# Patient Record
Sex: Male | Born: 1935 | Race: White | Hispanic: No | Marital: Married | Smoking: Former smoker
Health system: Southern US, Community
[De-identification: ages and names within clinical notes are randomized; demographics above are authoritative.]

## PROBLEM LIST (undated history)

## (undated) DIAGNOSIS — C3491 Malignant neoplasm of unspecified part of right bronchus or lung: Secondary | ICD-10-CM

## (undated) DIAGNOSIS — I10 Essential (primary) hypertension: Secondary | ICD-10-CM

## (undated) DIAGNOSIS — E119 Type 2 diabetes mellitus without complications: Secondary | ICD-10-CM

## (undated) DIAGNOSIS — C3490 Malignant neoplasm of unspecified part of unspecified bronchus or lung: Secondary | ICD-10-CM

## (undated) DIAGNOSIS — N289 Disorder of kidney and ureter, unspecified: Secondary | ICD-10-CM

## (undated) DIAGNOSIS — M1990 Unspecified osteoarthritis, unspecified site: Secondary | ICD-10-CM

## (undated) DIAGNOSIS — J189 Pneumonia, unspecified organism: Secondary | ICD-10-CM

## (undated) DIAGNOSIS — C61 Malignant neoplasm of prostate: Secondary | ICD-10-CM

## (undated) DIAGNOSIS — K289 Gastrojejunal ulcer, unspecified as acute or chronic, without hemorrhage or perforation: Secondary | ICD-10-CM

## (undated) HISTORY — PX: OTHER SURGICAL HISTORY: SHX169

## (undated) HISTORY — DX: Malignant neoplasm of unspecified part of right bronchus or lung: C34.91

## (undated) HISTORY — PX: NOSE SURGERY: SHX723

## (undated) HISTORY — PX: TOE SURGERY: SHX1073

---

## 2004-06-29 ENCOUNTER — Ambulatory Visit: Payer: Self-pay | Admitting: Oncology

## 2004-07-02 ENCOUNTER — Ambulatory Visit: Payer: Self-pay | Admitting: Urology

## 2004-07-19 ENCOUNTER — Emergency Department: Payer: Self-pay | Admitting: Emergency Medicine

## 2004-07-21 ENCOUNTER — Emergency Department: Payer: Self-pay | Admitting: Emergency Medicine

## 2004-07-26 ENCOUNTER — Emergency Department: Payer: Self-pay | Admitting: Emergency Medicine

## 2004-07-27 ENCOUNTER — Emergency Department: Payer: Self-pay | Admitting: Unknown Physician Specialty

## 2004-07-29 ENCOUNTER — Ambulatory Visit: Payer: Self-pay | Admitting: Oncology

## 2008-09-25 ENCOUNTER — Emergency Department: Payer: Self-pay | Admitting: Emergency Medicine

## 2008-10-16 ENCOUNTER — Encounter: Payer: Self-pay | Admitting: Unknown Physician Specialty

## 2008-10-31 ENCOUNTER — Ambulatory Visit: Payer: Self-pay | Admitting: Gastroenterology

## 2009-10-11 ENCOUNTER — Inpatient Hospital Stay: Payer: Self-pay | Admitting: Internal Medicine

## 2011-06-09 ENCOUNTER — Emergency Department: Payer: Self-pay | Admitting: *Deleted

## 2013-07-05 ENCOUNTER — Ambulatory Visit: Payer: Self-pay

## 2013-07-12 ENCOUNTER — Ambulatory Visit: Payer: Self-pay | Admitting: Internal Medicine

## 2013-07-19 DIAGNOSIS — N1330 Unspecified hydronephrosis: Secondary | ICD-10-CM | POA: Insufficient documentation

## 2013-11-01 ENCOUNTER — Emergency Department: Payer: Self-pay | Admitting: Emergency Medicine

## 2013-11-01 LAB — COMPREHENSIVE METABOLIC PANEL
AST: 18 U/L (ref 15–37)
Albumin: 3.8 g/dL (ref 3.4–5.0)
Alkaline Phosphatase: 92 U/L
Anion Gap: 7 (ref 7–16)
BUN: 17 mg/dL (ref 7–18)
Bilirubin,Total: 0.6 mg/dL (ref 0.2–1.0)
CHLORIDE: 102 mmol/L (ref 98–107)
CO2: 26 mmol/L (ref 21–32)
Calcium, Total: 9.2 mg/dL (ref 8.5–10.1)
Creatinine: 1 mg/dL (ref 0.60–1.30)
EGFR (Non-African Amer.): >60
Glucose: 91 mg/dL (ref 65–99)
Osmolality: 271 (ref 275–301)
Potassium: 3.7 mmol/L (ref 3.5–5.1)
SGPT (ALT): 23 U/L (ref 12–78)
SODIUM: 135 mmol/L — AB (ref 136–145)
Total Protein: 7.4 g/dL (ref 6.4–8.2)

## 2013-11-01 LAB — URINALYSIS, COMPLETE
Bilirubin,UR: NEGATIVE
Glucose,UR: NEGATIVE mg/dL (ref 0–75)
Ketone: NEGATIVE
Nitrite: NEGATIVE
PH: 7 (ref 4.5–8.0)
PROTEIN: NEGATIVE
RBC,UR: 19 /HPF (ref 0–5)
SPECIFIC GRAVITY: 1.009 (ref 1.003–1.030)
SQUAMOUS EPITHELIAL: NONE SEEN
WBC UR: 42 /HPF (ref 0–5)

## 2013-11-01 LAB — CBC
HCT: 42.2 % (ref 40.0–52.0)
HGB: 13.9 g/dL (ref 13.0–18.0)
MCH: 29 pg (ref 26.0–34.0)
MCHC: 32.8 g/dL (ref 32.0–36.0)
MCV: 89 fL (ref 80–100)
PLATELETS: 272 10*3/uL (ref 150–440)
RBC: 4.78 10*6/uL (ref 4.40–5.90)
RDW: 13.5 % (ref 11.5–14.5)
WBC: 12.9 10*3/uL — ABNORMAL HIGH (ref 3.8–10.6)

## 2013-11-03 LAB — URINE CULTURE

## 2013-11-05 ENCOUNTER — Inpatient Hospital Stay: Payer: Self-pay | Admitting: Internal Medicine

## 2013-11-05 LAB — CBC WITH DIFFERENTIAL/PLATELET
BASOS PCT: 0.4 %
Basophil #: 0 10*3/uL (ref 0.0–0.1)
EOS PCT: 1.4 %
Eosinophil #: 0.1 10*3/uL (ref 0.0–0.7)
HCT: 41.9 % (ref 40.0–52.0)
HGB: 14.2 g/dL (ref 13.0–18.0)
LYMPHS PCT: 31 %
Lymphocyte #: 1.9 10*3/uL (ref 1.0–3.6)
MCH: 30.1 pg (ref 26.0–34.0)
MCHC: 34 g/dL (ref 32.0–36.0)
MCV: 89 fL (ref 80–100)
MONO ABS: 0.7 x10 3/mm (ref 0.2–1.0)
Monocyte %: 11.2 %
NEUTROS PCT: 56 %
Neutrophil #: 3.4 10*3/uL (ref 1.4–6.5)
Platelet: 251 10*3/uL (ref 150–440)
RBC: 4.73 10*6/uL (ref 4.40–5.90)
RDW: 13.6 % (ref 11.5–14.5)
WBC: 6 10*3/uL (ref 3.8–10.6)

## 2013-11-05 LAB — URINALYSIS, COMPLETE
Bacteria: NONE SEEN
Bilirubin,UR: NEGATIVE
GLUCOSE, UR: NEGATIVE mg/dL (ref 0–75)
Ketone: NEGATIVE
NITRITE: NEGATIVE
PROTEIN: NEGATIVE
Ph: 5 (ref 4.5–8.0)
Specific Gravity: 1.016 (ref 1.003–1.030)
Squamous Epithelial: 1
WBC UR: 33 /HPF (ref 0–5)

## 2013-11-05 LAB — BASIC METABOLIC PANEL
Anion Gap: 8 (ref 7–16)
BUN: 34 mg/dL — AB (ref 7–18)
CHLORIDE: 101 mmol/L (ref 98–107)
CO2: 26 mmol/L (ref 21–32)
Calcium, Total: 8.4 mg/dL — ABNORMAL LOW (ref 8.5–10.1)
Creatinine: 1.5 mg/dL — ABNORMAL HIGH (ref 0.60–1.30)
GFR CALC AF AMER: 47 — AB
GFR CALC NON AF AMER: 41 — AB
Glucose: 93 mg/dL (ref 65–99)
Osmolality: 277 (ref 275–301)
POTASSIUM: 3.9 mmol/L (ref 3.5–5.1)
Sodium: 135 mmol/L — ABNORMAL LOW (ref 136–145)

## 2013-11-05 LAB — TROPONIN I: TROPONIN-I: 0.02 ng/mL

## 2013-11-06 LAB — COMPREHENSIVE METABOLIC PANEL
ANION GAP: 5 — AB (ref 7–16)
Albumin: 2.7 g/dL — ABNORMAL LOW (ref 3.4–5.0)
Alkaline Phosphatase: 66 U/L
BILIRUBIN TOTAL: 0.3 mg/dL (ref 0.2–1.0)
BUN: 29 mg/dL — ABNORMAL HIGH (ref 7–18)
CALCIUM: 7.7 mg/dL — AB (ref 8.5–10.1)
Chloride: 103 mmol/L (ref 98–107)
Co2: 28 mmol/L (ref 21–32)
Creatinine: 1.31 mg/dL — ABNORMAL HIGH (ref 0.60–1.30)
EGFR (African American): 56 — ABNORMAL LOW
EGFR (Non-African Amer.): 48 — ABNORMAL LOW
Glucose: 129 mg/dL — ABNORMAL HIGH (ref 65–99)
Osmolality: 279 (ref 275–301)
POTASSIUM: 3.4 mmol/L — AB (ref 3.5–5.1)
SGOT(AST): 37 U/L (ref 15–37)
SGPT (ALT): 25 U/L (ref 12–78)
Sodium: 136 mmol/L (ref 136–145)
Total Protein: 5.4 g/dL — ABNORMAL LOW (ref 6.4–8.2)

## 2013-11-06 LAB — CBC WITH DIFFERENTIAL/PLATELET
Basophil #: 0 10*3/uL (ref 0.0–0.1)
Basophil %: 0.3 %
Eosinophil #: 0.1 10*3/uL (ref 0.0–0.7)
Eosinophil %: 2.3 %
HCT: 37.4 % — AB (ref 40.0–52.0)
HGB: 12.7 g/dL — ABNORMAL LOW (ref 13.0–18.0)
LYMPHS ABS: 1.3 10*3/uL (ref 1.0–3.6)
Lymphocyte %: 33.8 %
MCH: 30.4 pg (ref 26.0–34.0)
MCHC: 34.1 g/dL (ref 32.0–36.0)
MCV: 89 fL (ref 80–100)
Monocyte #: 0.4 x10 3/mm (ref 0.2–1.0)
Monocyte %: 11.5 %
Neutrophil #: 2 10*3/uL (ref 1.4–6.5)
Neutrophil %: 52.1 %
Platelet: 157 10*3/uL (ref 150–440)
RBC: 4.19 10*6/uL — ABNORMAL LOW (ref 4.40–5.90)
RDW: 13.8 % (ref 11.5–14.5)
WBC: 3.9 10*3/uL (ref 3.8–10.6)

## 2013-11-06 LAB — MAGNESIUM: MAGNESIUM: 1.7 mg/dL — AB

## 2013-11-07 LAB — CBC WITH DIFFERENTIAL/PLATELET
BASOS ABS: 0 10*3/uL (ref 0.0–0.1)
BASOS ABS: 0 10*3/uL (ref 0.0–0.1)
BASOS PCT: 0.2 %
Basophil %: 0.3 %
EOS ABS: 0.1 10*3/uL (ref 0.0–0.7)
Eosinophil #: 0.1 10*3/uL (ref 0.0–0.7)
Eosinophil %: 2.1 %
Eosinophil %: 2 %
HCT: 37.2 % — AB (ref 40.0–52.0)
HCT: 36.6 % — AB (ref 40.0–52.0)
HGB: 12.6 g/dL — ABNORMAL LOW (ref 13.0–18.0)
HGB: 12.5 g/dL — ABNORMAL LOW (ref 13.0–18.0)
LYMPHS ABS: 1.7 10*3/uL (ref 1.0–3.6)
LYMPHS PCT: 34.9 %
Lymphocyte #: 1.3 10*3/uL (ref 1.0–3.6)
Lymphocyte %: 44.2 %
MCH: 29.9 pg (ref 26.0–34.0)
MCH: 30.2 pg (ref 26.0–34.0)
MCHC: 33.7 g/dL (ref 32.0–36.0)
MCHC: 34.1 g/dL (ref 32.0–36.0)
MCV: 89 fL (ref 80–100)
MCV: 89 fL (ref 80–100)
MONO ABS: 0.4 x10 3/mm (ref 0.2–1.0)
Monocyte #: 0.4 x10 3/mm (ref 0.2–1.0)
Monocyte %: 10.9 %
Monocyte %: 11 %
NEUTROS PCT: 42.6 %
Neutrophil #: 1.6 10*3/uL (ref 1.4–6.5)
Neutrophil #: 1.9 10*3/uL (ref 1.4–6.5)
Neutrophil %: 51.8 %
Platelet: 175 10*3/uL (ref 150–440)
Platelet: 129 10*3/uL — ABNORMAL LOW (ref 150–440)
RBC: 4.2 10*6/uL — ABNORMAL LOW (ref 4.40–5.90)
RBC: 4.13 10*6/uL — AB (ref 4.40–5.90)
RDW: 13.6 % (ref 11.5–14.5)
RDW: 13.6 % (ref 11.5–14.5)
WBC: 3.8 10*3/uL (ref 3.8–10.6)
WBC: 3.8 10*3/uL (ref 3.8–10.6)

## 2013-11-07 LAB — BASIC METABOLIC PANEL
ANION GAP: 5 — AB (ref 7–16)
BUN: 23 mg/dL — ABNORMAL HIGH (ref 7–18)
CALCIUM: 8.5 mg/dL (ref 8.5–10.1)
CHLORIDE: 104 mmol/L (ref 98–107)
CO2: 27 mmol/L (ref 21–32)
Creatinine: 1.13 mg/dL (ref 0.60–1.30)
EGFR (Non-African Amer.): 58 — ABNORMAL LOW
Glucose: 179 mg/dL — ABNORMAL HIGH (ref 65–99)
Osmolality: 280 (ref 275–301)
Potassium: 3.7 mmol/L (ref 3.5–5.1)
Sodium: 136 mmol/L (ref 136–145)

## 2013-11-07 LAB — OCCULT BLOOD X 1 CARD TO LAB, STOOL: OCCULT BLOOD, FECES: POSITIVE

## 2013-11-07 LAB — HEMATOCRIT: HCT: 34.9 % — ABNORMAL LOW (ref 40.0–52.0)

## 2013-11-08 LAB — URINE CULTURE

## 2013-11-08 LAB — BASIC METABOLIC PANEL
ANION GAP: 6 — AB (ref 7–16)
BUN: 14 mg/dL (ref 7–18)
CREATININE: 0.82 mg/dL (ref 0.60–1.30)
Calcium, Total: 7.9 mg/dL — ABNORMAL LOW (ref 8.5–10.1)
Chloride: 107 mmol/L (ref 98–107)
Co2: 26 mmol/L (ref 21–32)
EGFR (African American): >60
Glucose: 140 mg/dL — ABNORMAL HIGH (ref 65–99)
Osmolality: 280 (ref 275–301)
POTASSIUM: 3.6 mmol/L (ref 3.5–5.1)
Sodium: 139 mmol/L (ref 136–145)

## 2013-11-08 LAB — HEMATOCRIT: HCT: 34.8 % — ABNORMAL LOW (ref 40.0–52.0)

## 2013-11-09 LAB — BASIC METABOLIC PANEL
Anion Gap: 5 — ABNORMAL LOW (ref 7–16)
BUN: 18 mg/dL (ref 7–18)
CHLORIDE: 108 mmol/L — AB (ref 98–107)
CO2: 25 mmol/L (ref 21–32)
Calcium, Total: 8.4 mg/dL — ABNORMAL LOW (ref 8.5–10.1)
Creatinine: 0.92 mg/dL (ref 0.60–1.30)
EGFR (African American): >60
GLUCOSE: 211 mg/dL — AB (ref 65–99)
Osmolality: 284 (ref 275–301)
Potassium: 4 mmol/L (ref 3.5–5.1)
Sodium: 138 mmol/L (ref 136–145)

## 2013-11-09 LAB — HEMATOCRIT: HCT: 33.9 % — ABNORMAL LOW (ref 40.0–52.0)

## 2013-11-10 LAB — CULTURE, BLOOD (SINGLE)

## 2013-12-03 ENCOUNTER — Ambulatory Visit: Payer: Self-pay | Admitting: Internal Medicine

## 2013-12-20 ENCOUNTER — Ambulatory Visit: Payer: Self-pay | Admitting: Urology

## 2014-07-01 ENCOUNTER — Ambulatory Visit: Payer: Self-pay | Admitting: Urology

## 2014-07-17 DIAGNOSIS — E119 Type 2 diabetes mellitus without complications: Secondary | ICD-10-CM | POA: Insufficient documentation

## 2014-08-07 ENCOUNTER — Ambulatory Visit: Payer: Self-pay | Admitting: Ophthalmology

## 2014-08-07 LAB — POTASSIUM: POTASSIUM: 3.9 mmol/L (ref 3.5–5.1)

## 2014-08-13 ENCOUNTER — Ambulatory Visit: Payer: Self-pay | Admitting: Ophthalmology

## 2014-10-14 ENCOUNTER — Ambulatory Visit: Payer: Self-pay | Admitting: Ophthalmology

## 2014-10-14 LAB — POTASSIUM: Potassium: 4.2 mmol/L (ref 3.5–5.1)

## 2014-10-17 ENCOUNTER — Ambulatory Visit: Payer: Self-pay | Admitting: Ophthalmology

## 2015-01-18 NOTE — Consult Note (Signed)
Brief Consult Note: Diagnosis: Epigastric mild  pain and tenderness, nausea-chronic, decreased appetite possible weight loss, reports melena, advil use, duodentitis on CT.   Patient was seen by consultant.   Consult note dictated.   Comments: No NSAIDs. Increase Protonix to '40mg'$  IV  bid. Recommend avoiding po potassium suppl as can be irritating to the stomach. Close monitoring of cbc. Stool cards. Consider EGD when clinically feasible per Dr. Vira Agar recommendation.  Electronic Signatures: Gershon Mussel (NP)  (Signed 10-Feb-15 15:34)  Authored: Brief Consult Note   Last Updated: 10-Feb-15 15:34 by Gershon Mussel (NP)

## 2015-01-18 NOTE — Consult Note (Signed)
Pt with enterococcus and staph in urine.  Treatment per primary doctor.  Lips welling could have been partly due to bite block or possibly meds.  Hgb good, still some melena but this could take a few days to clear.  No abd pain, no vomiting.  Can advance to mechanical soft diet on or before discharge.    Electronic Signatures: Manya Silvas (MD)  (Signed on 12-Feb-15 18:09)  Authored  Last Updated: 12-Feb-15 18:09 by Manya Silvas (MD)

## 2015-01-18 NOTE — Consult Note (Signed)
Brief Consult Note: Diagnosis: Nausea, poor appetite, weight loss.   Comments: This case was d/w Dr. Vira Agar. Plan to proceed with EGD in the am. Hold po Kcl for now.  Electronic Signatures: Gershon Mussel (NP)  (Signed 10-Feb-15 17:04)  Authored: Brief Consult Note   Last Updated: 10-Feb-15 17:04 by Gershon Mussel (NP)

## 2015-01-18 NOTE — Discharge Summary (Signed)
PATIENT NAME:  William Mullen, William Mullen MR#:  [id number] DATE OF BIRTH:  October 23, 1922  DATE OF ADMISSION:  11/05/2013 DATE OF DISCHARGE:  11/09/2013   FINAL DIAGNOSES:  1. Systemic inflammatory response syndrome due to urinary tract infection.  2. Duodenal ulcer with acute gastrointestinal bleeding.  3. Acute blood loss anemia secondary to duodenal ulcer.  4. Hypertension.  5. 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  6. Lung nodule.   HISTORY AND PHYSICAL: Please see dictated admission history and physical.   HOSPITAL COURSE: The patient was admitted with evidence of fever, systemic inflammatory response syndrome. He had been evaluated in the Emergency Room and thought to have urinary tract infection, although this was difficult to evaluate secondary to the patient's history of urostomy. He had been placed on Cipro, but developed nausea and vomiting and came back to the Emergency Room with continued nausea and vomiting. He underwent CT scan which revealed evidence of inflammation of the duodenum. GI was consulted as the patient was having black tarry stools which were heme-positive, consistent with GI bleed. He underwent endoscopy which revealed a duodenal ulcer with active bleeding. This was treated, and his bleeding appeared to have stabilized and his hematocrit remained stable. He did not have further fevers. His urine culture grew out coag-negative staph and enterococcus.   The patient developed lip swelling on the afternoon of his endoscopy, which progressed into the next day, which was consistent with angioedema. Antibiotics were stopped, he was taken off the ACE inhibitor that he had been on, and prednisone, H2-blockers and H1-blockers were administered. He had marked improvement in his symptoms. With the finding of enterococcus, amoxicillin was added back in due to sensitivities and the fact that there was a question of whether the patient nausea to Cipro. He was observed  after getting a dose of this and did not have any worsening symptoms, and so the ANGIOEDEMA WAS FELT TO LIKELY BE RELATED TO HIS USE OF ACE INHIBITORS, AND HE WAS ADVISED THAT HE NEEDS TO STAY AWAY FROM THESE MEDICATIONS IN THE FUTURE.   He was noted to have an 11 mm nodule on CT scan on arrival. This will need further followup, and we will set up evaluation of this as outpatient.   Physical therapy ambulated the patient, and he did well, though he continues to have some balance issues. He wished to go home, and as he is tolerating his diet and is medically stable, he will be discharged home in stable condition with his physical activity to be up as tolerated with walker or cane. He should keep his diet bland for the next 3 to 4 days, then progress to 2 gram sodium diet. He should check his sugars daily and record this, understanding that his sugars will be elevated until he comes off of his prednisone. Outpatient physical therapy will be arranged at [facility] where he lives. He will follow up in our office in the next 1 to 2 weeks, and we will have him return to see Dr. Vira Agar in the next 3 to 4 weeks for consideration for repeat endoscopy. He should avoid aspirin or anti-inflammatories.   DISCHARGE MEDICATIONS:  1. Lumigan 0.03% 1 drop to each eye in the evening. 2. Betoptic 0.25% 1 drop to each eye b.i.d.   3. Amlodipine 5 mg p.o. daily.  4. Glyburide/metformin 5/500 mg 2 p.o. b.i.d.  5. Alphagan 0.1% 1 drop to the left eye 2 times a day.  6. Prednisone  taper starting at 40 mg daily, decreasing by 10 mg every 2 days until done.  7. Zyrtec 10 mg p.o. daily for 8 days.  8. Atorvastatin 10 mg p.o. daily, which replaced simvastatin.  9. Ranitidine 150 mg p.o. b.i.d. for 8 days.  10. Azelastine 1 spray into both nostrils twice a day for 1 week.  11. Amoxicillin 500 mg p.o. t.i.d. for 10 days.  12. Pantoprazole 40 mg p.o. b.i.d.  He was given instructions to hold potassium, avoid  simvastatin, and he may stop Bactrim.  HE AGAIN NEEDS TO STAY AWAY FROM LISINOPRIL, AND HE IS ADVISED THAT HE MOST LIKELY IS NOW ALLERGIC TO THIS MEDICATION AND MEDICATIONS LIKE IT.   ____________________________ Adin Hector, MD bjk:lb D: 11/09/2013 12:50:32 ET T: 11/09/2013 13:09:03 ET JOB#: [id number]  cc: Adin Hector, MD, <Dictator> Ramonita Lab MD ELECTRONICALLY SIGNED 11/21/2013 8:05

## 2015-01-18 NOTE — Consult Note (Signed)
EGD showed actively oozing duodenal ulcer with adherent clot in the distal second portion of the duodenum.  this was injected with 8 ml of epi and 4 clips placed across the ulcer with good results.  Some bleeding from scope irritation in mouth due to pressure from the scope while treating the ulcer.  Clear liq diet today, full liquids tomorrow.  Also duodenal ulcer with edema in bulb.  Electronic Signatures: Manya Silvas (MD)  (Signed on 11-Feb-15 11:35)  Authored  Last Updated: 11-Feb-15 11:35 by Manya Silvas (MD)

## 2015-01-18 NOTE — Consult Note (Signed)
Pt seen by NP Dawson Bills and case discussed with her.  I talked to patient about doing an EGD tomorrow for his abnomal CT and he is in agreement.  EGD for tomorrow.  Electronic Signatures: Manya Silvas (MD)  (Signed on 10-Feb-15 18:36)  Authored  Last Updated: 10-Feb-15 18:36 by Manya Silvas (MD)

## 2015-01-18 NOTE — Consult Note (Signed)
PATIENT NAME:  William Mullen, William Mullen MR#:  [id number] DATE OF BIRTH:  01-14-1923  DATE OF CONSULTATION:  11/06/2013  REFERRING PHYSICIAN:  Adin Hector, MD CONSULTING PHYSICIAN:  Manya Silvas, MD/Arlayne Liggins A. Jerelene Redden, ANP (Adult Nurse Practitioner)  REASON FOR CONSULTATION: Nausea.   HISTORY OF PRESENT ILLNESS: This 81 year old patient was admitted to the hospital yesterday with sepsis, mid abdominal pain and fevers and chills. He had presented to the Emergency Room a day prior, was placed on Cipro then switched to Septra because of nausea. In the Emergency Room, he was found to have a temp of 101.8. A CT showed inflammation of the duodenum. He has had persistent intermittent episodes of nausea and GI has been consulted regarding further evaluation.   The patient has had problems with intermittent nausea for several years. He reports Cipro seemed to really make it worse. Over the last 2 to 3 days, he has had pain in the mid abdomen and he has seen black, tarry stools. He did have nausea but no vomiting, decreased appetite and reports subjective weight loss. He says his normal weight is about 200 pounds and he thinks he is less than 195. The patient did develop a fever of 100 to 100.3 on Thursday. He has been feeling weak. He says he can barely walk when he stands up. He has passed black stools and  had mild diarrhea and black stool this morning. He denies Pepto-Bismol or iron medication. The patient has mild mid to upper abdominal discomfort. He has been sipping on liquids today, no further vomiting. He did undergo colonoscopy 10/31/2008 that showed diverticulosis. No record of local upper endoscopy study. He has a baseline hemoglobin around 13.8 range from October 2014. Hemoglobin today 12.7.   PAST MEDICAL HISTORY: 1.  Sarcoma wrapped around the bladder, required ileal conduit and a urostomy.  2.  Diabetes mellitus.  3.  Hypertension.  4.  Hyperlipidemia.  5.  Rosacea.  6.  Diverticulosis  per colonoscopy in 2010.  7.  Diabetes mellitus.   8.  History of glaucoma.  9.  Osteoarthritis.   PAST SURGICAL HISTORY:   1.  Pancreatic cyst removed 1986.  2.  Left knee replacement in 1989.  3.  Bilateral cataract surgery.   MEDICATIONS:  1.  Norvasc 5 mg daily.  2.  Aspirin 81 mg daily.  3.  Lisinopril/HCT 20/12.5 mg daily.  4.  Lumigan 1 drop both eyes at bedtime.  5.  Multivitamin daily.  6.  Alphagan 1 drop left eye twice daily.  7.  Glyburide/metformin 5/500 mg 1 tablet twice daily.  8.  Lipitor 10 mg daily.  9.  Potassium chloride 10 mEq daily.  10.  Magnesium daily.  11.  Advil 2 to 3 tablets a day.    ALLERGY:  AUGMENTIN CAUSES NAUSEA. CIPRO CAUSES NAUSEA.   SOCIAL HISTORY: Negative tobacco or alcohol.   FAMILY HISTORY: Positive for hypertension, diabetes, arthritis. Negative for colon cancer, colon polyps.   REVIEW OF SYSTEMS: Positive review of systems as noted in the history of present illness. He denies chest pain or shortness of breath or DOE. Head feels funny and full at times. No nasal congestion. He denies dizziness, lightheadedness. Positive hard of hearing, recently lost his on hearing aids. He feels overall weak. Having abdominal discomfort today, says it is not bad but it is present. He has noted no blood in the urine bag. He has been taking Advil 2 to 3 times a day for last several  months.   PHYSICAL EXAMINATION: VITAL SIGNS: Temperature 98, 65, 18, 157/71. Pulse ox on room air is 91%. He has been afebrile this admission.  GENERAL: Elderly Caucasian male presents alone. He is alone today.  HEENT: Head is normocephalic. Conjunctivae pink. Sclerae anicteric. Oral mucosa is moist and intact.  CARDIAC: S1 and S2 without murmur or gallop.  PULMONARY: Lungs are CTA. Respirations are eupneic.  ABDOMEN:  Soft, mild epigastric tenderness. No rigidity, rebound or guarding. Stoma in place right lower abdomen, draining clear fluid, stoma is intact.   EXTREMITIES:  Without edema, cyanosis or clubbing.  SKIN: Warm and dry without rash.  NEUROLOGIC: Grossly nonfocal.  MUSCULOSKELETAL: No joint swelling, inflammation. Gait not evaluated.   LABORATORY, DIAGNOSTIC AND RADIOLOGICAL DATA:  1.  BUN today 29, creatinine 1.31, potassium 3.4, magnesium 1.7, albumin 2.7. Liver labs otherwise unremarkable. Troponin 0.02. WBC 3.9, hemoglobin was 14.2, down 12.7, normal indices. Blood culture negative. Urine culture was mixed contamination. UA shows 3+ leukocyte esterase, WBCs 33 per high-power field. The patient is on Rocephin and azithromycin.  2.  Radiology: Chest x-ray is within normal. CT of the abdomen and pelvis without contrast performed 11/05/2013 for investigation of urinary tract infection showed a urostomy in the right lower quadrant of the abdomen. Significant wall thickening and surrounding inflammation seen involving the duodenum concerning for severe duodenitis or peptic ulcer disease. There was an 11 mm irregular density in the inferior portion of the lingula which is new since prior exam. CT of the chest is recommended for further evaluation.   IMPRESSION:  1.  The patient presents with a suspicious urinalysis with sepsis with temperature of 101.8. The urine culture was negative for specific organism. The patient has ileal conduit due to prior bladder resection. Dr. Caryl Comes notes the urinalysis always shows WBC inflammation. The patient was given Cipro, which causes known nausea. It was discontinued. He is on azithromycin and Rocephin and remains afebrile. WBC is normal and the patient says he is having no signs or symptoms of infection at this time.  2.  Positive nausea. This preceded this admission by several months and chart review shows he has had intermittent episodes of nausea. The patient does report Advil use and has epigastric tenderness and a noncontrasted CT of the abdomen shows significant wall thickening and surrounding inflammation involving the  duodenum concerning for duodenitis or peptic ulcer disease. We do not have records of prior esophagogastroduodenoscopy. He denies vomiting. He has decreased appetite and reports about a 5+ subjective weight loss.  3.  Prior colonoscopy 2010 with tortuous colon, diverticulosis and nonbleeding, large internal hemorrhoids.  4.  Renal insufficiency on admission thought secondary to dehydration is slowly improving.   PLAN: 1.  Recommend discontinue NSAIDs. Hold oral potassium as it can irritate the stomach and duodenum.  2.  Increase the Protonix to 40 mg IV b.i.d.  3.  The patient is on clear liquid diet. Would recommend holding oral potassium as that can be irritating to the stomach lining.  4.  He reports black stools, would recommend Hemoccult cards.  5.  Consider upper endoscopy when clinically feasible and will discuss this case with Dr. Vira Agar in collaboration of care.   Thank you for this consult. These services provided by Joelene Millin A. Jerelene Redden, MS, APRN, BC, ANP (Adult Nurse Practitioner) under collaborative agreement with Manya Silvas, MD.    ____________________________ Janalyn Harder Jerelene Redden, ANP (Adult Nurse Practitioner) kam:cs D: 11/06/2013 15:29:00 ET T: 11/06/2013 15:48:57 ET JOB#: [id number]  cc:  Breeann Reposa A. Jerelene Redden, ANP (Adult Nurse Practitioner), <Dictator> Janalyn Harder Sherlyn Hay, MSN, ANP-BC Adult Nurse Practitioner ELECTRONICALLY SIGNED 11/06/2013 17:42

## 2015-01-18 NOTE — H&P (Signed)
PATIENT NAME:  William Mullen, William Mullen MR#:  [id number] DATE OF BIRTH:  [birth year]  DATE OF ADMISSION:  11/05/2013  HISTORY OF PRESENT ILLNESS:  This 81 year old male presents with sepsis and mild abdominal pain. He was in his usual state of health until 2 to 3 days ago, when he started having fever and chills, came to the ED, was found to have a UTI, placed on initially Cipro and then switched to Septra, continued having profound nausea. Comes into the ED with tachycardia, temp of 101.8 and a UTI. CT of the abdomen shows inflammation of the duodenum. The patient also has some mild cough. In light of these symptoms, he will be admitted for further evaluation and treatment.   PAST MEDICAL HISTORY:   MEDICAL ILLNESSES:  History of sarcoma, diabetes mellitus, hypertension, hyperlipidemia, rosacea.   SURGERY: Left knee replacement, cataracts, pancreatic cyst removal.   ALLERGIES: AUGMENTIN AND CIPRO.   MEDICATIONS: Norvasc 5 mg daily, aspirin 81 mg daily, lisinopril/HCT 20/12.5 daily, Lumigan 1 drop both eyes at bedtime, multivitamin daily, Alphagan 1 drop left eye twice a day, glyburide and metformin 5/500 b.i.d., Lipitor 10 mg daily, potassium chloride 10 mEq daily, magnesium daily.   SOCIAL HISTORY: No smoking or alcohol.   FAMILY HISTORY: Significant for hypertension.   REVIEW OF SYSTEMS: Otherwise negative.   PHYSICAL EXAMINATION: VITAL SIGNS: Blood pressure 120/75 temp 101.5.  HEENT: Pupils are equal and reactive.  LUNGS: Clear to auscultation and percussion.  HEART: Mild tachycardia. No audible murmur.  ABDOMEN: Bowel sounds present, soft, mild epigastric tenderness, no rebound.  EXTREMITIES: No edema.  NEUROLOGIC: Grossly nonfocal.   LABORATORY, DIAGNOSTIC AND RADIOLOGICAL DATA:  CT of the abdomen shows duodenitis and an 11 mm lung lesion. Creatinine up to 1.5. White count 6. Troponin negative.   ASSESSMENT AND PLAN: 1.  Sepsis/systemic inflammatory response syndrome, likely urinary.  We will treat with IV Rocephin and Flagyl in light of Cipro allergy. Will add azithromycin with his cough. Blood cultures pending.  2.  Acute on chronic renal failure, getting IV fluids aggressively.  3.  Lung lesion. Will need CT followup.  4.  Duodenitis. Will follow exam, on p.o. Protonix.   OVERALL PROGNOSIS: Guarded.    ____________________________ Rusty Aus, MD mfm:cs D: 11/05/2013 20:29:49 ET T: 11/05/2013 20:38:56 ET JOB#: [id number]  cc: Rusty Aus, MD, <Dictator> Renan Danese Roselee Culver MD ELECTRONICALLY SIGNED 11/05/2013 20:56

## 2015-01-22 ENCOUNTER — Ambulatory Visit
Admit: 2015-01-22 | Disposition: A | Discharge status: Home / Self Care | Payer: Self-pay | Attending: Internal Medicine | Admitting: Internal Medicine

## 2015-01-31 ENCOUNTER — Other Ambulatory Visit: Payer: Self-pay | Admitting: Internal Medicine

## 2015-01-31 DIAGNOSIS — C3401 Malignant neoplasm of right main bronchus: Secondary | ICD-10-CM

## 2015-02-10 ENCOUNTER — Ambulatory Visit: Payer: Self-pay

## 2015-02-11 ENCOUNTER — Ambulatory Visit
Admission: RE | Admit: 2015-02-11 | Discharge: 2015-02-11 | Disposition: A | Discharge status: Home / Self Care | Payer: Medicare Other | Source: Ambulatory Visit | Attending: Internal Medicine | Admitting: Internal Medicine

## 2015-02-11 DIAGNOSIS — C3401 Malignant neoplasm of right main bronchus: Secondary | ICD-10-CM

## 2015-02-11 DIAGNOSIS — R911 Solitary pulmonary nodule: Secondary | ICD-10-CM | POA: Insufficient documentation

## 2015-02-11 HISTORY — DX: Malignant neoplasm of unspecified part of right bronchus or lung: C34.91

## 2015-02-11 LAB — GLUCOSE, CAPILLARY: Glucose-Capillary: 135 mg/dL — ABNORMAL HIGH (ref 65–99)

## 2015-02-11 MED ORDER — FLUDEOXYGLUCOSE F - 18 (FDG) INJECTION
12.4900 | Freq: Once | INTRAVENOUS | Status: AC | PRN
  Administered 2015-02-11: 12.49 via INTRAVENOUS

## 2015-02-18 ENCOUNTER — Inpatient Hospital Stay: Payer: Medicare Other | Attending: Oncology | Admitting: Oncology

## 2015-02-18 ENCOUNTER — Inpatient Hospital Stay: Payer: Medicare Other

## 2015-02-18 ENCOUNTER — Encounter: Payer: Self-pay | Admitting: Oncology

## 2015-02-18 VITALS — BP 160/80 | HR 72 | Temp 97.8°F | Wt 193.8 lb

## 2015-02-18 DIAGNOSIS — C3491 Malignant neoplasm of unspecified part of right bronchus or lung: Secondary | ICD-10-CM

## 2015-02-18 DIAGNOSIS — Z8546 Personal history of malignant neoplasm of prostate: Secondary | ICD-10-CM

## 2015-02-18 DIAGNOSIS — R918 Other nonspecific abnormal finding of lung field: Secondary | ICD-10-CM | POA: Insufficient documentation

## 2015-02-18 DIAGNOSIS — R599 Enlarged lymph nodes, unspecified: Secondary | ICD-10-CM | POA: Diagnosis not present

## 2015-02-18 DIAGNOSIS — Z87891 Personal history of nicotine dependence: Secondary | ICD-10-CM | POA: Insufficient documentation

## 2015-02-18 DIAGNOSIS — R0602 Shortness of breath: Secondary | ICD-10-CM | POA: Diagnosis not present

## 2015-02-18 DIAGNOSIS — R05 Cough: Secondary | ICD-10-CM | POA: Diagnosis not present

## 2015-02-18 DIAGNOSIS — Z79899 Other long term (current) drug therapy: Secondary | ICD-10-CM | POA: Insufficient documentation

## 2015-02-18 LAB — CBC WITH DIFFERENTIAL/PLATELET
BASOS ABS: 0 10*3/uL (ref 0–0.1)
BASOS PCT: 0 %
EOS ABS: 0.2 10*3/uL (ref 0–0.7)
Eosinophils Relative: 3 %
HEMATOCRIT: 36.2 % — AB (ref 40.0–52.0)
HEMOGLOBIN: 11.8 g/dL — AB (ref 13.0–18.0)
Lymphocytes Relative: 23 %
Lymphs Abs: 1.7 10*3/uL (ref 1.0–3.6)
MCH: 27.6 pg (ref 26.0–34.0)
MCHC: 32.6 g/dL (ref 32.0–36.0)
MCV: 84.8 fL (ref 80.0–100.0)
MONO ABS: 0.6 10*3/uL (ref 0.2–1.0)
Monocytes Relative: 9 %
Neutro Abs: 4.7 10*3/uL (ref 1.4–6.5)
Neutrophils Relative %: 65 %
Platelets: 245 10*3/uL (ref 150–440)
RBC: 4.27 MIL/uL — ABNORMAL LOW (ref 4.40–5.90)
RDW: 13.9 % (ref 11.5–14.5)
WBC: 7.2 10*3/uL (ref 3.8–10.6)

## 2015-02-18 LAB — PROTIME-INR
INR: 1
PROTHROMBIN TIME: 13.4 s (ref 11.4–15.0)

## 2015-02-18 LAB — APTT: aPTT: 29 seconds (ref 24–36)

## 2015-02-18 NOTE — Progress Notes (Signed)
Pt referred by Dr. Caryl Comes for lung lesions.  Pt has hx of bladder and prostate cancer.  Does have living will.  Was former smoker. Quit 1982.

## 2015-02-19 ENCOUNTER — Encounter: Payer: Self-pay | Admitting: Oncology

## 2015-02-19 ENCOUNTER — Telehealth: Payer: Self-pay | Admitting: *Deleted

## 2015-02-19 DIAGNOSIS — H409 Unspecified glaucoma: Secondary | ICD-10-CM | POA: Insufficient documentation

## 2015-02-19 DIAGNOSIS — M1990 Unspecified osteoarthritis, unspecified site: Secondary | ICD-10-CM | POA: Insufficient documentation

## 2015-02-19 DIAGNOSIS — K649 Unspecified hemorrhoids: Secondary | ICD-10-CM | POA: Insufficient documentation

## 2015-02-19 DIAGNOSIS — Z859 Personal history of malignant neoplasm, unspecified: Secondary | ICD-10-CM | POA: Insufficient documentation

## 2015-02-19 DIAGNOSIS — K269 Duodenal ulcer, unspecified as acute or chronic, without hemorrhage or perforation: Secondary | ICD-10-CM | POA: Insufficient documentation

## 2015-02-19 DIAGNOSIS — Z8719 Personal history of other diseases of the digestive system: Secondary | ICD-10-CM | POA: Insufficient documentation

## 2015-02-19 DIAGNOSIS — E785 Hyperlipidemia, unspecified: Secondary | ICD-10-CM | POA: Insufficient documentation

## 2015-02-19 DIAGNOSIS — C3491 Malignant neoplasm of unspecified part of right bronchus or lung: Secondary | ICD-10-CM

## 2015-02-19 HISTORY — DX: Malignant neoplasm of unspecified part of right bronchus or lung: C34.91

## 2015-02-19 NOTE — Telephone Encounter (Signed)
Oncology Nurse Navigator Documentation  Oncology Nurse Navigator Flowsheets 02/19/2015 02/19/2015  Navigator Encounter Type Initial MedOnc Telephone  Patient Visit Type Medonc Follow-up  Treatment Phase Abnormal Scans -  Interventions Coordination of Care Coordination of Care  Coordination of Care Bronch Bronch  Time Spent with Patient 65 30   Notified patient of pre-op appointment scheduled for 02/20/15 at 945am in [place], 2nd floor, to bring medications. Also notified of procedure scheduled immediately after pre admit appt. Reminded to be NPO after midnight, continue to avoid anticoagulant medications, and have a driver and support person to be with patient at home after the procedure. Patient verbalizes understanding and reads back appointments.

## 2015-02-19 NOTE — Progress Notes (Signed)
[place] @ [facility] [phone]  Fax:([phone]) [place] OB: Dec 15, 1922  MR#: [id number]  TOI#:[id number]  Patient Care Team: Adin Hector, MD as PCP - General (Internal Medicine)  CHIEF COMPLAINT:  Chief Complaint  Patient presents with  . New Evaluation    VISIT DIAGNOSIS:     ICD-9-CM ICD-10-CM   1. Lung mass 786.6 R91.8 amLODipine (NORVASC) 5 MG tablet     atorvastatin (LIPITOR) 20 MG tablet     LUMIGAN 0.01 % SOLN     dorzolamide-timolol (COSOPT) 22.3-6.8 MG/ML ophthalmic solution     lisinopril-hydrochlorothiazide (PRINZIDE,ZESTORETIC) 20-12.5 MG per tablet     pantoprazole (PROTONIX) 40 MG tablet     KLOR-CON M10 10 MEQ tablet     bimatoprost (LUMIGAN) 0.03 % ophthalmic solution     CBC with Differential     Comprehensive metabolic panel     Protime-INR     APTT     CBC with Differential     Comprehensive metabolic panel     Protime-INR     APTT      No history exists.    No flowsheet data found.  INTERVAL HISTORY: 81 year old gentleman was referred to me by Dr. Margarito Liner a history of dry hacking cough without purulent last few months had abnormal chest x-ray followed by CT scan and PET scan which revealed multiple abnormal mediastinotomy adenopathy and right upper lobe nodule which were FDG active..  Patient is here for ongoing evaluation and planning of treatment.  Does not have any hemoptysis.  No chest pain.  Shortness of breath on exertion. 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 REVIEW OF SYSTEMS:   Gen. status: Alert oriented individual not in any acute distress.  Formal status is 1. HENT: No headache.  No difficulty in swallowing.  No neck masses. Lungs: Shortness of breath on exertion.  Dry hacking cough.  No hemoptysis. GI: Had diarrhea and no constipation.  No rectal bleeding.  No abdominal pain. Lower extremityswelling.  Skin: No  rash. Neurological system: No headache no dizziness. GU: Patient has some cystectomy and ostomy.  Has recurrent DVT tract infection. Psychiatric system: No major symptom Patient lives in assisted living facility His wife also has a lung cancer which is recurrent and under hospice care   As per HPI. Otherwise, a complete review of systems is negatve.  PAST MEDICAL HISTORY: Past Medical History  Diagnosis Date  . Cancer of right lung   Patient had a sarcoma off prostate status post cystoprostatectomy 8 history of cataracts.  Glaucoma.  Patient is hard of hearing. Review of diabetes.  PAST SURGICAL HISTORY: As mentioned aboVE FAMILY HISTORY NO FAMILY HISTORY OF BREAST CANCER, COLON CANCER, OVARIAN CANCER      ADVANCED DIRECTIVES:  does have advanced healthcare directive  HEALTH MAINTENANCE: History  Substance Use Topics  . Smoking status: Former Smoker    Quit date: 01/10/1981  . Smokeless tobacco: Not on file  . Alcohol Use: Not on file      Allergies  Allergen Reactions  . Amoxicillin-Pot Clavulanate Nausea Only  . Ciprofloxacin Nausea Only    Current Outpatient Prescriptions  Medication Sig Dispense Refill  . bimatoprost (LUMIGAN) 0.03 % ophthalmic solution Frequency:QHS   Dosage:0.0     Instructions:  Note:Dose: 0.03 %    . amLODipine (NORVASC) 5 MG tablet     . atorvastatin (LIPITOR) 20 MG tablet     .  dorzolamide-timolol (COSOPT) 22.3-6.8 MG/ML ophthalmic solution     . KLOR-CON M10 10 MEQ tablet     . lisinopril-hydrochlorothiazide (PRINZIDE,ZESTORETIC) 20-12.5 MG per tablet     . LUMIGAN 0.01 % SOLN     . pantoprazole (PROTONIX) 40 MG tablet      No current facility-administered medications for this visit.    OBJECTIVE: PHYSICAL EXAM: GENERAL:  Well developed, well nourished, sitting comfortably in the exam room in no acute distress. MENTAL STATUS:  Alert and oriented to person, place and time. HEAD: alopecia.  Normocephalic, atraumatic, face  symmetric, no Cushingoid features. EYES:  .  Pupils equal round and reactive to light and accomodation.  No conjunctivitis or scleral icterus. ENT:  Oropharynx clear without lesion.  Tongue normal. Mucous membranes moist.  RESPIRATORY:  Emphysematous chest.  Diminished air entry on both sides.  Occasional rhonchi on the right side CARDIOVASCULAR:  Regular rate and rhythm without murmur, rub or gallop. BREAST:  Right breast without masses, skin changes or nipple discharge.  Left breast without masses, skin changes or nipple discharge. ABDOMEN:  Soft, non-tender, with active bowel sounds, and no hepatosplenomegaly.  No masses. BACK:  No CVA tenderness.  No tenderness on percussion of the back or rib cage. SKIN:  No rashes, ulcers or lesions. EXTREMITIES: No edema, no skin discoloration or tenderness.  No palpable cords. LYMPH NODES: No palpable cervical, supraclavicular, axillary or inguinal adenopathy  NEUROLOGICAL: Unremarkable. PSYCH:  Appropriate.  Filed Vitals:   02/18/15 1511  BP: 160/80  Pulse: 72  Temp: 97.8 F (36.6 C)     Body mass index is 27.04 kg/(m^2).    ECOG FS:1 - Symptomatic but completely ambulatory  LAB RESULTS:  Office Visit on 02/18/2015  Component Date Value Ref Range Status  . WBC 02/18/2015 7.2  3.8 - 10.6 K/uL Final  . RBC 02/18/2015 4.27* 4.40 - 5.90 MIL/uL Final  . Hemoglobin 02/18/2015 11.8* 13.0 - 18.0 g/dL Final  . HCT 02/18/2015 36.2* 40.0 - 52.0 % Final  . MCV 02/18/2015 84.8  80.0 - 100.0 fL Final  . MCH 02/18/2015 27.6  26.0 - 34.0 pg Final  . MCHC 02/18/2015 32.6  32.0 - 36.0 g/dL Final  . RDW 02/18/2015 13.9  11.5 - 14.5 % Final  . Platelets 02/18/2015 245  150 - 440 K/uL Final  . Neutrophils Relative % 02/18/2015 65   Final  . Neutro Abs 02/18/2015 4.7  1.4 - 6.5 K/uL Final  . Lymphocytes Relative 02/18/2015 23   Final  . Lymphs Abs 02/18/2015 1.7  1.0 - 3.6 K/uL Final  . Monocytes Relative 02/18/2015 9   Final  . Monocytes Absolute  02/18/2015 0.6  0.2 - 1.0 K/uL Final  . Eosinophils Relative 02/18/2015 3   Final  . Eosinophils Absolute 02/18/2015 0.2  0 - 0.7 K/uL Final  . Basophils Relative 02/18/2015 0   Final  . Basophils Absolute 02/18/2015 0.0  0 - 0.1 K/uL Final  . Prothrombin Time 02/18/2015 13.4  11.4 - 15.0 seconds Final  . INR 02/18/2015 1.00   Final  . aPTT 02/18/2015 29  24 - 36 seconds Final   Outside lab dated January 16, 2015 Glucose 127. Sodium 139  Potassium 4.3 creatinine 1.1  Liver enzymes are normal   STUDIES: Nm Pet Image Initial (pi) Skull Base To Thigh  02/11/2015   CLINICAL DATA:  Initial treatment strategy for right lung cancer.  EXAM: NUCLEAR MEDICINE PET SKULL BASE TO THIGH  TECHNIQUE: 12.49 mCi F-18 FDG  was injected intravenously. Full-ring PET imaging was performed from the skull base to thigh after the radiotracer. CT data was obtained and used for attenuation correction and anatomic localization.  FASTING BLOOD GLUCOSE:  Value: 135 mg/dl  COMPARISON:  CT chest dated 01/22/2015.  PET-CT dated 12/03/2013.  FINDINGS: NECK  5 mm short axis right supraclavicular node (series 3/image 84), max SUV 4.2.  CHEST  Evaluation of the run parenchyma is constrained by respiratory motion.  3.3 x 1.8 cm spiculated right upper lobe opacity (series 3/ image 80), max SUV 9.2, suspicious for primary bronchogenic neoplasm.  Associated patchy ground-glass opacity in the central right upper lobe (series 3/image 80), max SUV 6.5, possibly reflecting endobronchial spread of tumor or infection.  Associated 1.9 x 1.7 cm central perihilar nodule (series 3/ image 84), max SUV 7.5, worrisome for a perihilar nodal metastasis.  8 mm subpleural nodule inferiorly in the right middle lobe (series 3/image 121), max SUV 8.4.  Extensive thoracic nodal metastases, including:  --1.6 cm short axis high right paratracheal node (series 3/image 73), max SUV 12.3  --1.8 cm short axis right paratracheal node (series 3/ image 82), max SUV 14.0   --2.8 cm right suprahilar node (series 3/image 94), max SUV 10.8  --9 mm subcarinal node (series 3/ image 95), max SUV 8.1  ABDOMEN/PELVIS  No abnormal hypermetabolic activity within the liver, pancreas, adrenal glands, or spleen.  No hypermetabolic lymph nodes in the abdomen or pelvis.  Left renal scarring/atrophy. Postsurgical changes related to prior ventral hernia mesh repair. Status post cystectomy with right lower quadrant urostomy. Atherosclerotic calcifications of the abdominal aorta and branch vessels.  SKELETON  No focal hypermetabolic activity to suggest skeletal metastasis.  IMPRESSION: 3.3 x 1.8 cm hypermetabolic spiculated right upper lobe mass, suspicious for primary bronchogenic neoplasm.  Suspected 8 mm pulmonary metastasis in the inferior right middle lobe.  Associated right perihilar, mediastinal, and right supraclavicular nodal metastases.  No findings suspicious for metastatic disease in the abdomen/pelvis.   Electronically Signed   By: Julian Hy M.D.   On: 02/11/2015 16:00   Ct Lung Screening  01/22/2015   CLINICAL DATA:  Follow-up of abnormal chest x-ray.  EXAM: CT CHEST WITHOUT CONTRAST  TECHNIQUE: Multidetector CT imaging of the chest was performed following the standard protocol without IV contrast.  COMPARISON:  PET-CT dated 12/03/2013  FINDINGS: The patient has developed a poorly defined 3.0 x 1.5 cm inhomogeneous mass in the right upper lobe anterior laterally seen best on image 25 of series 4. There is new right hilar and mediastinal adenopathy. Right hilar mass measures 4.2 x 2.9 cm. New pretracheal lymph node measures 12 mm on image 19, precarinal lymph node measures 13 mm on image 27 pre carinal lymph node measures 10 mm on image 29, all on series 2. There is also a new 2.0 x 2.0 cm right superior hilar mass.  4 mm nodule in the anterior medial aspect of the right upper lobe on image 21 of series 4 is unchanged since the PET-CT. Calcified granuloma in the right lower lobe  on image 30, 3.6 mm nodule in the right lower lobe on image 48 intrapulmonary node along the minor fissure on the right on image 36 2 mm nodule in the right upper lobe on image 32, 4 mm nodules in the left upper lobe on images 18 and 19 and intrapulmonary node along the major fissure on image 32, all on series 4.  Heart size is normal. The visualized portion of  the upper abdomen demonstrates at least 1 tiny stone in the gallbladder.  No acute osseous abnormalities.  IMPRESSION: 1. New spiculated mass in the right upper lobe with new mediastinal and right hilar adenopathy and masses. I suspect this represents a primary carcinoma of the lung with metastasis. 2. Multiple small bilateral pulmonary nodules which appear unchanged since the PET-CT scan.   Electronically Signed   By: Lorriane Shire M.D.   On: 01/22/2015 15:36    ASSESSMENT:  abnormal CT scan and a PET scan.    CT scan and PET scan is been reviewed independently Patient has a right upper lobe nodule 3.8 cm.  Multiple mediastinal and hilar lymphadenopathy.   PLAN:  Vernell Barrier picture in this patient is suggestive of carcinoma of lung metastases to the multiple lymph nodes.  There are bilateral small pulmonary nodule which very well could be metastectomy disease.   I had prolonged discussion with patient and family.  Wife is present  I discussed situation with our navigator Andrews.  And CT scan was evaluated by Dr. Mortimer Fries.  And discussed situation with him possibility of endoscopy bronchial ultrasound and biopsy has been planned.     I discussed pros and cons of this procedure.  And the fact that if we find this to be malignant further treatment options may include radiation and chemotherapy which patient is willing to go through.   Overall performance status is fairly good.  His age needs to be considered in any further planning of treatment   Patient expressed understanding and was in agreement with this plan. He also understands that He can  call clinic at any time with any questions, concerns, or complaints.    No matching staging information was found for the patient.  Forest Gleason, MD   02/19/2015 1:23 PM

## 2015-02-19 NOTE — Progress Notes (Signed)
Oncology Nurse Navigator Documentation  Oncology Nurse Navigator Flowsheets 02/19/2015  Navigator Encounter Type Initial MedOnc  Patient Visit Type Medonc  Treatment Phase Abnormal Scans  Interventions Coordination of Care  Coordination of Care Bronch  Time Spent with Patient 6   Will arrange for [place] if Dr. Mortimer Fries in agreement.

## 2015-02-20 ENCOUNTER — Ambulatory Visit: Payer: Medicare Other | Admitting: Anesthesiology

## 2015-02-20 ENCOUNTER — Encounter
Admission: RE | Disposition: A | Discharge status: Home / Self Care | Payer: Self-pay | Source: Ambulatory Visit | Attending: Internal Medicine

## 2015-02-20 ENCOUNTER — Ambulatory Visit
Admission: RE | Admit: 2015-02-20 | Discharge: 2015-02-20 | Disposition: A | Discharge status: Home / Self Care | Payer: Medicare Other | Source: Ambulatory Visit | Attending: Internal Medicine | Admitting: Internal Medicine

## 2015-02-20 DIAGNOSIS — C33 Malignant neoplasm of trachea: Secondary | ICD-10-CM | POA: Insufficient documentation

## 2015-02-20 DIAGNOSIS — I10 Essential (primary) hypertension: Secondary | ICD-10-CM | POA: Insufficient documentation

## 2015-02-20 DIAGNOSIS — R590 Localized enlarged lymph nodes: Secondary | ICD-10-CM | POA: Diagnosis not present

## 2015-02-20 DIAGNOSIS — M1990 Unspecified osteoarthritis, unspecified site: Secondary | ICD-10-CM | POA: Diagnosis not present

## 2015-02-20 DIAGNOSIS — H409 Unspecified glaucoma: Secondary | ICD-10-CM | POA: Diagnosis not present

## 2015-02-20 DIAGNOSIS — Z79899 Other long term (current) drug therapy: Secondary | ICD-10-CM | POA: Diagnosis not present

## 2015-02-20 DIAGNOSIS — R918 Other nonspecific abnormal finding of lung field: Secondary | ICD-10-CM | POA: Diagnosis present

## 2015-02-20 DIAGNOSIS — Z87891 Personal history of nicotine dependence: Secondary | ICD-10-CM | POA: Insufficient documentation

## 2015-02-20 DIAGNOSIS — Z8546 Personal history of malignant neoplasm of prostate: Secondary | ICD-10-CM | POA: Diagnosis not present

## 2015-02-20 DIAGNOSIS — J449 Chronic obstructive pulmonary disease, unspecified: Secondary | ICD-10-CM | POA: Diagnosis not present

## 2015-02-20 DIAGNOSIS — E1136 Type 2 diabetes mellitus with diabetic cataract: Secondary | ICD-10-CM | POA: Diagnosis not present

## 2015-02-20 DIAGNOSIS — N289 Disorder of kidney and ureter, unspecified: Secondary | ICD-10-CM | POA: Diagnosis not present

## 2015-02-20 HISTORY — PX: ENDOBRONCHIAL ULTRASOUND: SHX5096

## 2015-02-20 LAB — GLUCOSE, CAPILLARY
Glucose-Capillary: 163 mg/dL — ABNORMAL HIGH (ref 65–99)
Glucose-Capillary: 119 mg/dL — ABNORMAL HIGH (ref 65–99)

## 2015-02-20 SURGERY — ENDOBRONCHIAL ULTRASOUND (EBUS)
Anesthesia: General

## 2015-02-20 MED ORDER — FENTANYL CITRATE (PF) 100 MCG/2ML IJ SOLN: 25.0000 ug | INTRAMUSCULAR | Status: DC | PRN

## 2015-02-20 MED ORDER — ONDANSETRON HCL 4 MG/2ML IJ SOLN
INTRAMUSCULAR | Status: DC | PRN
  Administered 2015-02-20: 4 mg via INTRAVENOUS

## 2015-02-20 MED ORDER — ROCURONIUM BROMIDE 100 MG/10ML IV SOLN
INTRAVENOUS | Status: DC | PRN
  Administered 2015-02-20: 5 mg via INTRAVENOUS

## 2015-02-20 MED ORDER — SUCCINYLCHOLINE CHLORIDE 20 MG/ML IJ SOLN
INTRAMUSCULAR | Status: DC | PRN
  Administered 2015-02-20: 100 mg via INTRAVENOUS

## 2015-02-20 MED ORDER — PROPOFOL 10 MG/ML IV BOLUS
INTRAVENOUS | Status: DC | PRN
  Administered 2015-02-20: 150 mg via INTRAVENOUS

## 2015-02-20 MED ORDER — PHENYLEPHRINE HCL 10 MG/ML IJ SOLN
INTRAMUSCULAR | Status: DC | PRN
  Administered 2015-02-20: 100 ug via INTRAVENOUS
  Administered 2015-02-20 (×2): 50 ug via INTRAVENOUS

## 2015-02-20 MED ORDER — ONDANSETRON HCL 4 MG/2ML IJ SOLN
4.0000 mg | Freq: Once | INTRAMUSCULAR | Status: DC | PRN
Start: 2015-02-20 — End: 2015-02-20

## 2015-02-20 MED ORDER — SODIUM CHLORIDE 0.9 % IV SOLN
INTRAVENOUS | Status: DC
  Administered 2015-02-20: 11:00:00 via INTRAVENOUS

## 2015-02-20 MED ORDER — FENTANYL CITRATE (PF) 100 MCG/2ML IJ SOLN
INTRAMUSCULAR | Status: DC | PRN
  Administered 2015-02-20: 50 ug via INTRAVENOUS

## 2015-02-20 MED ORDER — LIDOCAINE HCL (CARDIAC) 20 MG/ML IV SOLN
INTRAVENOUS | Status: DC | PRN
  Administered 2015-02-20: 60 mg via INTRAVENOUS

## 2015-02-20 NOTE — Anesthesia Postprocedure Evaluation (Signed)
  Anesthesia Post-op Note  Patient: William Mullen  Procedure(s) Performed: Procedure(s): ENDOBRONCHIAL ULTRASOUND (N/A)  Anesthesia type:General  Patient location: PACU  Post pain: Pain level controlled  Post assessment: Post-op Vital signs reviewed, Patient's Cardiovascular Status Stable, Respiratory Function Stable, Patent Airway and No signs of Nausea or vomiting  Post vital signs: Reviewed and stable  Last Vitals:  Filed Vitals:   02/20/15 1237  BP: 137/80  Pulse: 66  Temp:   Resp: 17    Level of consciousness: awake, alert  and patient cooperative  Complications: No apparent anesthesia complications

## 2015-02-20 NOTE — Anesthesia Procedure Notes (Signed)
Procedure Name: Intubation Date/Time: 02/20/2015 11:51 AM Performed by: Dionne Bucy Pre-anesthesia Checklist: Timeout performed, Patient identified, Patient being monitored, Suction available and Emergency Drugs available Patient Re-evaluated:Patient Re-evaluated prior to inductionOxygen Delivery Method: Circle system utilized Preoxygenation: Pre-oxygenation with 100% oxygen Intubation Type: IV induction Ventilation: Mask ventilation without difficulty Laryngoscope Size: Mac and 4 Grade View: Grade I Tube type: Oral Tube size: 8.5 mm Number of attempts: 1 Airway Equipment and Method: Stylet Placement Confirmation: ETT inserted through vocal cords under direct vision,  positive ETCO2 and breath sounds checked- equal and bilateral Secured at: 23 cm Tube secured with: Tape Dental Injury: Teeth and Oropharynx as per pre-operative assessment

## 2015-02-20 NOTE — Anesthesia Preprocedure Evaluation (Signed)
Anesthesia Evaluation  Patient identified by MRN, date of birth, ID band Patient awake    Reviewed: Allergy & Precautions, NPO status , Patient's Chart, lab work & pertinent test results  History of Anesthesia Complications Negative for: history of anesthetic complications  Airway Mallampati: II  TM Distance: >3 FB Neck ROM: Full    Dental  (+) Teeth Intact, Chipped,    Pulmonary COPD COPD inhaler, former smoker (quit x 20 yrs),          Cardiovascular hypertension, Pt. on medications     Neuro/Psych    GI/Hepatic PUD,   Endo/Other  diabetes, Type 2  Renal/GU Renal disease (s/p resection of bladder)     Musculoskeletal  (+) Arthritis -, Osteoarthritis,    Abdominal   Peds  Hematology   Anesthesia Other Findings S/p prostatectomy for CA   Reproductive/Obstetrics                             Anesthesia Physical Anesthesia Plan  ASA: III  Anesthesia Plan: General   Post-op Pain Management:    Induction: Intravenous  Airway Management Planned: Oral ETT  Additional Equipment:   Intra-op Plan:   Post-operative Plan:   Informed Consent: I have reviewed the patients History and Physical, chart, labs and discussed the procedure including the risks, benefits and alternatives for the proposed anesthesia with the patient or authorized representative who has indicated his/her understanding and acceptance.     Plan Discussed with:   Anesthesia Plan Comments:         Anesthesia Quick Evaluation

## 2015-02-20 NOTE — H&P (View-Only) (Signed)
[place] @ [facility] [phone]  Fax:([phone]) [place]: Jan 31, 1923  MR#: [id number]  ONG#:[id number]  Patient Care Team: Adin Hector, MD as PCP - General (Internal Medicine)  CHIEF COMPLAINT:  Chief Complaint  Patient presents with  . New Evaluation    VISIT DIAGNOSIS:     ICD-9-CM ICD-10-CM   1. Lung mass 786.6 R91.8 amLODipine (NORVASC) 5 MG tablet     atorvastatin (LIPITOR) 20 MG tablet     LUMIGAN 0.01 % SOLN     dorzolamide-timolol (COSOPT) 22.3-6.8 MG/ML ophthalmic solution     lisinopril-hydrochlorothiazide (PRINZIDE,ZESTORETIC) 20-12.5 MG per tablet     pantoprazole (PROTONIX) 40 MG tablet     KLOR-CON M10 10 MEQ tablet     bimatoprost (LUMIGAN) 0.03 % ophthalmic solution     CBC with Differential     Comprehensive metabolic panel     Protime-INR     APTT     CBC with Differential     Comprehensive metabolic panel     Protime-INR     APTT      No history exists.    No flowsheet data found.  INTERVAL HISTORY: 81 year old gentleman was referred to me by Dr. Margarito Liner a history of dry hacking cough without purulent last few months had abnormal chest x-ray followed by CT scan and PET scan which revealed multiple abnormal mediastinotomy adenopathy and right upper lobe nodule which were FDG active..  Patient is here for ongoing evaluation and planning of treatment.  Does not have any hemoptysis.  No chest pain.  Shortness of breath on exertion. 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 REVIEW OF SYSTEMS:   Gen. status: Alert oriented individual not in any acute distress.  Formal status is 1. HENT: No headache.  No difficulty in swallowing.  No neck masses. Lungs: Shortness of breath on exertion.  Dry hacking cough.  No hemoptysis. GI: Had diarrhea and no constipation.  No rectal bleeding.  No abdominal pain. Lower extremityswelling.  Skin: No  rash. Neurological system: No headache no dizziness. GU: Patient has some cystectomy and ostomy.  Has recurrent DVT tract infection. Psychiatric system: No major symptom Patient lives in assisted living facility His wife also has a lung cancer which is recurrent and under hospice care   As per HPI. Otherwise, a complete review of systems is negatve.  PAST MEDICAL HISTORY: Past Medical History  Diagnosis Date  . Cancer of right lung   Patient had a sarcoma off prostate status post cystoprostatectomy 8 history of cataracts.  Glaucoma.  Patient is hard of hearing. Review of diabetes.  PAST SURGICAL HISTORY: As mentioned aboVE FAMILY HISTORY NO FAMILY HISTORY OF BREAST CANCER, COLON CANCER, OVARIAN CANCER      ADVANCED DIRECTIVES:  does have advanced healthcare directive  HEALTH MAINTENANCE: History  Substance Use Topics  . Smoking status: Former Smoker    Quit date: 01/10/1981  . Smokeless tobacco: Not on file  . Alcohol Use: Not on file      Allergies  Allergen Reactions  . Amoxicillin-Pot Clavulanate Nausea Only  . Ciprofloxacin Nausea Only    Current Outpatient Prescriptions  Medication Sig Dispense Refill  . bimatoprost (LUMIGAN) 0.03 % ophthalmic solution Frequency:QHS   Dosage:0.0     Instructions:  Note:Dose: 0.03 %    . amLODipine (NORVASC) 5 MG tablet     . atorvastatin (LIPITOR) 20 MG tablet     .  dorzolamide-timolol (COSOPT) 22.3-6.8 MG/ML ophthalmic solution     . KLOR-CON M10 10 MEQ tablet     . lisinopril-hydrochlorothiazide (PRINZIDE,ZESTORETIC) 20-12.5 MG per tablet     . LUMIGAN 0.01 % SOLN     . pantoprazole (PROTONIX) 40 MG tablet      No current facility-administered medications for this visit.    OBJECTIVE: PHYSICAL EXAM: GENERAL:  Well developed, well nourished, sitting comfortably in the exam room in no acute distress. MENTAL STATUS:  Alert and oriented to person, place and time. HEAD: alopecia.  Normocephalic, atraumatic, face  symmetric, no Cushingoid features. EYES:  .  Pupils equal round and reactive to light and accomodation.  No conjunctivitis or scleral icterus. ENT:  Oropharynx clear without lesion.  Tongue normal. Mucous membranes moist.  RESPIRATORY:  Emphysematous chest.  Diminished air entry on both sides.  Occasional rhonchi on the right side CARDIOVASCULAR:  Regular rate and rhythm without murmur, rub or gallop. BREAST:  Right breast without masses, skin changes or nipple discharge.  Left breast without masses, skin changes or nipple discharge. ABDOMEN:  Soft, non-tender, with active bowel sounds, and no hepatosplenomegaly.  No masses. BACK:  No CVA tenderness.  No tenderness on percussion of the back or rib cage. SKIN:  No rashes, ulcers or lesions. EXTREMITIES: No edema, no skin discoloration or tenderness.  No palpable cords. LYMPH NODES: No palpable cervical, supraclavicular, axillary or inguinal adenopathy  NEUROLOGICAL: Unremarkable. PSYCH:  Appropriate.  Filed Vitals:   02/18/15 1511  BP: 160/80  Pulse: 72  Temp: 97.8 F (36.6 C)     Body mass index is 27.04 kg/(m^2).    ECOG FS:1 - Symptomatic but completely ambulatory  LAB RESULTS:  Office Visit on 02/18/2015  Component Date Value Ref Range Status  . WBC 02/18/2015 7.2  3.8 - 10.6 K/uL Final  . RBC 02/18/2015 4.27* 4.40 - 5.90 MIL/uL Final  . Hemoglobin 02/18/2015 11.8* 13.0 - 18.0 g/dL Final  . HCT 02/18/2015 36.2* 40.0 - 52.0 % Final  . MCV 02/18/2015 84.8  80.0 - 100.0 fL Final  . MCH 02/18/2015 27.6  26.0 - 34.0 pg Final  . MCHC 02/18/2015 32.6  32.0 - 36.0 g/dL Final  . RDW 02/18/2015 13.9  11.5 - 14.5 % Final  . Platelets 02/18/2015 245  150 - 440 K/uL Final  . Neutrophils Relative % 02/18/2015 65   Final  . Neutro Abs 02/18/2015 4.7  1.4 - 6.5 K/uL Final  . Lymphocytes Relative 02/18/2015 23   Final  . Lymphs Abs 02/18/2015 1.7  1.0 - 3.6 K/uL Final  . Monocytes Relative 02/18/2015 9   Final  . Monocytes Absolute  02/18/2015 0.6  0.2 - 1.0 K/uL Final  . Eosinophils Relative 02/18/2015 3   Final  . Eosinophils Absolute 02/18/2015 0.2  0 - 0.7 K/uL Final  . Basophils Relative 02/18/2015 0   Final  . Basophils Absolute 02/18/2015 0.0  0 - 0.1 K/uL Final  . Prothrombin Time 02/18/2015 13.4  11.4 - 15.0 seconds Final  . INR 02/18/2015 1.00   Final  . aPTT 02/18/2015 29  24 - 36 seconds Final   Outside lab dated January 16, 2015 Glucose 127. Sodium 139  Potassium 4.3 creatinine 1.1  Liver enzymes are normal   STUDIES: Nm Pet Image Initial (pi) Skull Base To Thigh  02/11/2015   CLINICAL DATA:  Initial treatment strategy for right lung cancer.  EXAM: NUCLEAR MEDICINE PET SKULL BASE TO THIGH  TECHNIQUE: 12.49 mCi F-18 FDG  was injected intravenously. Full-ring PET imaging was performed from the skull base to thigh after the radiotracer. CT data was obtained and used for attenuation correction and anatomic localization.  FASTING BLOOD GLUCOSE:  Value: 135 mg/dl  COMPARISON:  CT chest dated 01/22/2015.  PET-CT dated 12/03/2013.  FINDINGS: NECK  5 mm short axis right supraclavicular node (series 3/image 84), max SUV 4.2.  CHEST  Evaluation of the run parenchyma is constrained by respiratory motion.  3.3 x 1.8 cm spiculated right upper lobe opacity (series 3/ image 80), max SUV 9.2, suspicious for primary bronchogenic neoplasm.  Associated patchy ground-glass opacity in the central right upper lobe (series 3/image 80), max SUV 6.5, possibly reflecting endobronchial spread of tumor or infection.  Associated 1.9 x 1.7 cm central perihilar nodule (series 3/ image 84), max SUV 7.5, worrisome for a perihilar nodal metastasis.  8 mm subpleural nodule inferiorly in the right middle lobe (series 3/image 121), max SUV 8.4.  Extensive thoracic nodal metastases, including:  --1.6 cm short axis high right paratracheal node (series 3/image 73), max SUV 12.3  --1.8 cm short axis right paratracheal node (series 3/ image 82), max SUV 14.0   --2.8 cm right suprahilar node (series 3/image 94), max SUV 10.8  --9 mm subcarinal node (series 3/ image 95), max SUV 8.1  ABDOMEN/PELVIS  No abnormal hypermetabolic activity within the liver, pancreas, adrenal glands, or spleen.  No hypermetabolic lymph nodes in the abdomen or pelvis.  Left renal scarring/atrophy. Postsurgical changes related to prior ventral hernia mesh repair. Status post cystectomy with right lower quadrant urostomy. Atherosclerotic calcifications of the abdominal aorta and branch vessels.  SKELETON  No focal hypermetabolic activity to suggest skeletal metastasis.  IMPRESSION: 3.3 x 1.8 cm hypermetabolic spiculated right upper lobe mass, suspicious for primary bronchogenic neoplasm.  Suspected 8 mm pulmonary metastasis in the inferior right middle lobe.  Associated right perihilar, mediastinal, and right supraclavicular nodal metastases.  No findings suspicious for metastatic disease in the abdomen/pelvis.   Electronically Signed   By: Julian Hy M.D.   On: 02/11/2015 16:00   Ct Lung Screening  01/22/2015   CLINICAL DATA:  Follow-up of abnormal chest x-ray.  EXAM: CT CHEST WITHOUT CONTRAST  TECHNIQUE: Multidetector CT imaging of the chest was performed following the standard protocol without IV contrast.  COMPARISON:  PET-CT dated 12/03/2013  FINDINGS: The patient has developed a poorly defined 3.0 x 1.5 cm inhomogeneous mass in the right upper lobe anterior laterally seen best on image 25 of series 4. There is new right hilar and mediastinal adenopathy. Right hilar mass measures 4.2 x 2.9 cm. New pretracheal lymph node measures 12 mm on image 19, precarinal lymph node measures 13 mm on image 27 pre carinal lymph node measures 10 mm on image 29, all on series 2. There is also a new 2.0 x 2.0 cm right superior hilar mass.  4 mm nodule in the anterior medial aspect of the right upper lobe on image 21 of series 4 is unchanged since the PET-CT. Calcified granuloma in the right lower lobe  on image 30, 3.6 mm nodule in the right lower lobe on image 48 intrapulmonary node along the minor fissure on the right on image 36 2 mm nodule in the right upper lobe on image 32, 4 mm nodules in the left upper lobe on images 18 and 19 and intrapulmonary node along the major fissure on image 32, all on series 4.  Heart size is normal. The visualized portion of  the upper abdomen demonstrates at least 1 tiny stone in the gallbladder.  No acute osseous abnormalities.  IMPRESSION: 1. New spiculated mass in the right upper lobe with new mediastinal and right hilar adenopathy and masses. I suspect this represents a primary carcinoma of the lung with metastasis. 2. Multiple small bilateral pulmonary nodules which appear unchanged since the PET-CT scan.   Electronically Signed   By: Lorriane Shire M.D.   On: 01/22/2015 15:36    ASSESSMENT:  abnormal CT scan and a PET scan.    CT scan and PET scan is been reviewed independently Patient has a right upper lobe nodule 3.8 cm.  Multiple mediastinal and hilar lymphadenopathy.   PLAN:  Vernell Barrier picture in this patient is suggestive of carcinoma of lung metastases to the multiple lymph nodes.  There are bilateral small pulmonary nodule which very well could be metastectomy disease.   I had prolonged discussion with patient and family.  Wife is present  I discussed situation with our navigator Raymond.  And CT scan was evaluated by Dr. Mortimer Fries.  And discussed situation with him possibility of endoscopy bronchial ultrasound and biopsy has been planned.     I discussed pros and cons of this procedure.  And the fact that if we find this to be malignant further treatment options may include radiation and chemotherapy which patient is willing to go through.   Overall performance status is fairly good.  His age needs to be considered in any further planning of treatment   Patient expressed understanding and was in agreement with this plan. He also understands that He can  call clinic at any time with any questions, concerns, or complaints.    No matching staging information was found for the patient.  Forest Gleason, MD   02/19/2015 1:23 PM

## 2015-02-20 NOTE — Op Note (Signed)
PROCEDURE: ENDOBRONCHIAL ULTRASOUND   PROCEDURE DATE: 02/20/2015   TIME: 20 mins  NAME:  William Mullen  DOB:[birth year]  MRN: [id number] LOC:  ARPO/None          Indications/Preliminary Diagnosis:   Consent: (Place X beside choice/s below)  The benefits, risks and possible complications of the procedure were        explained to:  _x__ patient  _x__ patient's family  ___ other:___________  who verbalized understanding and gave:  ___ verbal  ___ written  _x__ verbal and written  ___ telephone  ___ other:________ consent.      Unable to obtain consent; procedure performed on emergent basis.     Other:       PRESEDATION ASSESSMENT: History and Physical has been performed. Patient meds and allergies have been reviewed. Presedation airway examination has been performed and documented. Baseline vital signs, sedation score, oxygenation status, and cardiac rhythm were reviewed. Patient was deemed to be in satisfactory condition to undergo the procedure.  PREMEDICATIONS: SEE ANESTHESIOLOGY RECORDS  Sedative/Narcotic Amt Dose   Versed  mg   Fentanyl  mcg  Diprivan  mg        Airway Prep (Place X beside choice below)   1% Transtracheal Lidocaine Anesthetization 7 cc   Patient prepped per Bronchoscopy Lab Policy       Insertion Route (Place X beside choice below)   Nasal   Oral  x Endotracheal Tube   Tracheostomy   INTRAPROCEDURE MEDICATIONS:  Sedative/Narcotic Amt Dose   Versed  mg   Fentanyl  mcg  Diprivan  mg       Medication Amt Dose  Medication Amt Dose  Xylocaine 2%  cc  Epinephrine 1:10,000 sol  cc  Xylocaine 4%  cc  Cocaine  cc   TECHNICAL PROCEDURES: (Place X beside choice below)   Procedures  Description    None     Electrocautery     Cryotherapy     Balloon Dilatation     Bronchography     Stent Placement     Therapeutic Aspiration     Laser/Argon Plasma    Brachytherapy Catheter Placement    Foreign Body Removal     SPECIMENS (Sites):  (Place X beside choice below)  Specimens Description   No Specimens Obtained     [place]ash[place]    Lavage    Biopsies   x Fine Needle Aspirates TRANSBRONCHIAL 21 gauge FNA x7 of RT lower Paratracheal Lesion   Brushings    Sputum    FINDINGS: Large RT lower Paratracheal lesion noted under direct visualization with Ultrasound, approx  4x4 CM    ESTIMATED BLOOD LOSS: none COMPLICATIONS/RESOLUTION: none   PROCEDURE DETAILS: Timeout performed and correct patient, name, & ID confirmed. Following prep per Pulmonary policy, appropriate sedation was administered.  Airway exam proceeded with findings, technical procedures, and specimen collection as noted below. At the end of exam the scope was withdrawn without incident. Impression and Plan as noted below.       IMPRESSION:POST-PROCEDURE DX: likely malignant    RECOMMENDATION/PLAN: follow up Pathology reports      Corrin Parker, M.D. Pulmonary & [place] Director Intensive Care Unit

## 2015-02-20 NOTE — Discharge Instructions (Signed)
Flexible Bronchoscopy, Care After ° °Refer to this sheet in the next few weeks. These instructions provide you with information on caring for yourself after your procedure. Your health care provider may also give you more specific instructions. Your treatment has been planned according to current medical practices, but problems sometimes occur. Call your health care provider if you have any problems or questions after your procedure.  °WHAT TO EXPECT AFTER THE PROCEDURE °It is normal to have the following symptoms for 24-48 hours after the procedure:  °· Increased cough. °· Low-grade fever. °· Sore throat or hoarse voice. °· Small streaks of blood in your thick spit (sputum) if tissue samples were taken (biopsy). °HOME CARE INSTRUCTIONS  °· Do not eat or drink anything for 2 hours after your procedure. Your nose and throat were numbed by medicine. If you try to eat or drink before the medicine wears off, food or drink could go into your lungs or you could burn yourself. After the numbness is gone and your cough and gag reflexes have returned, you may eat soft food and drink liquids slowly.   °· The day after the procedure, you can go back to your normal diet.   °· You may resume normal activities.   °· Keep all follow-up visits as directed by your health care provider. It is important to keep all your appointments, especially if tissue samples were taken for testing (biopsy). °SEEK IMMEDIATE MEDICAL CARE IF:  °· You have increasing shortness of breath.   °· You become light-headed or faint.   °· You have chest pain.   °· You have any new concerning symptoms. °· You cough up more than a small amount of blood. °· The amount of blood you cough up increases. °MAKE SURE YOU: °· Understand these instructions. °· Will watch your condition. °· Will get help right away if you are not doing well or get worse. °Document Released: 04/02/2005 Document Revised: 01/28/2014 Document Reviewed: 05/18/2013 °[employer]® Patient  Information ©2015 [employer]. This information is not intended to replace advice given to you by your health care provider. Make sure you discuss any questions you have with your health care provider. ° °

## 2015-02-20 NOTE — Interval H&P Note (Signed)
History and Physical Interval Note:  02/20/2015 10:45 AM  William Mullen  has presented today for surgery, with the diagnosis of LUNG MASS  The various methods of treatment have been discussed with the patient and family. After consideration of risks, benefits and other options for treatment, the patient has consented to  Procedure(s): ENDOBRONCHIAL ULTRASOUND (N/A) as a surgical intervention .  The patient's history has been reviewed, patient examined, no change in status, stable for surgery.  I have reviewed the patient's chart and labs.  Questions were answered to the patient's satisfaction.     [name]

## 2015-02-20 NOTE — Transfer of Care (Signed)
Immediate Anesthesia Transfer of Care Note  Patient: William Mullen  Procedure(s) Performed: Procedure(s): ENDOBRONCHIAL ULTRASOUND (N/A)  Patient Location: PACU  Anesthesia Type:General  Level of Consciousness: awake, alert  and oriented  Airway & Oxygen Therapy: Patient Spontanous Breathing and Patient connected to face mask oxygen  Post-op Assessment: Report given to RN  Post vital signs: Reviewed and stable  Last Vitals:  Filed Vitals:   02/20/15 1237  BP: 137/80  Pulse: 66  Temp: 98.8  Resp: 17    Complications: No apparent anesthesia complications

## 2015-03-03 LAB — CYTOLOGY - NON PAP

## 2015-03-04 ENCOUNTER — Inpatient Hospital Stay: Payer: Medicare Other | Attending: Oncology | Admitting: Oncology

## 2015-03-04 ENCOUNTER — Encounter: Payer: Self-pay | Admitting: Oncology

## 2015-03-04 VITALS — BP 143/81 | HR 61 | Temp 98.5°F | Wt 189.4 lb

## 2015-03-04 DIAGNOSIS — Z8589 Personal history of malignant neoplasm of other organs and systems: Secondary | ICD-10-CM

## 2015-03-04 DIAGNOSIS — R590 Localized enlarged lymph nodes: Secondary | ICD-10-CM | POA: Insufficient documentation

## 2015-03-04 DIAGNOSIS — Z79899 Other long term (current) drug therapy: Secondary | ICD-10-CM | POA: Insufficient documentation

## 2015-03-04 DIAGNOSIS — I700 Atherosclerosis of aorta: Secondary | ICD-10-CM | POA: Diagnosis not present

## 2015-03-04 DIAGNOSIS — Z9181 History of falling: Secondary | ICD-10-CM

## 2015-03-04 DIAGNOSIS — C3411 Malignant neoplasm of upper lobe, right bronchus or lung: Secondary | ICD-10-CM | POA: Diagnosis not present

## 2015-03-04 DIAGNOSIS — Z87891 Personal history of nicotine dependence: Secondary | ICD-10-CM | POA: Insufficient documentation

## 2015-03-04 DIAGNOSIS — R0609 Other forms of dyspnea: Secondary | ICD-10-CM | POA: Diagnosis not present

## 2015-03-04 DIAGNOSIS — C3491 Malignant neoplasm of unspecified part of right bronchus or lung: Secondary | ICD-10-CM

## 2015-03-04 DIAGNOSIS — R918 Other nonspecific abnormal finding of lung field: Secondary | ICD-10-CM | POA: Insufficient documentation

## 2015-03-04 NOTE — Progress Notes (Signed)
[place] @ [facility] [phone]) [phone]  Fax:(336) [place] OB: 05/02/1923  MR#: [id number]  RJJ#:[id number]  Patient Care Team: Adin Hector, MD as PCP - General (Internal Medicine)  CHIEF COMPLAINT:  Chief Complaint  Patient presents with  . Follow-up    VISIT DIAGNOSIS:     ICD-9-CM ICD-10-CM   1. Carcinoma of right lung 162.9 C34.91 glyBURIDE-metformin (GLUCOVANCE) 5-500 MG per tablet     CBC with Differential     Comprehensive metabolic panel     Oncology History   1.  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 Margarito Liner a history of dry hacking cough without purulent last few months had abnormal chest x-ray followed by CT scan and PET scan which revealed multiple abnormal mediastinotomy adenopathy and right upper lobe nodule which were FDG active..  Patient is here for ongoing evaluation and planning of treatment.  Does not have any hemoptysis.  No chest pain.  Shortness of breath on exertion. 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 June, seventh, 2016 Patient is here to discuss the result of endoscopy bronchial ultrasound which was suggestive of small cell undifferentiated carcinoma of lung stage IIIB disease Patient is here for ongoing evaluation Patient and his wife was also diagnosed lung cancer trying to move to assisted living facility No chills.  No fever. REVIEW OF SYSTEMS:   Gen. status: Alert oriented individual not in any acute distress.  Formal status is 1. HENT: No headache.  No difficulty in swallowing.  No neck masses. Lungs: Shortness of breath on exertion.  Dry hacking cough.  No  hemoptysis. GI: Had diarrhea and no constipation.  No rectal bleeding.  No abdominal pain. Lower extremityswelling.  Skin: No rash. Neurological system: No headache no dizziness. GU: Patient has some cystectomy and ostomy.  Has recurrent DVT tract infection. Psychiatric system: No major symptom Patient lives in assisted living facility His wife also has a lung cancer which is recurrent and under hospice care   As per HPI. Otherwise, a complete review of systems is negatve.  PAST MEDICAL HISTORY: Past Medical History  Diagnosis Date  . Cancer of right lung   . Carcinoma of right lung 02/19/2015  Patient had a sarcoma off prostate status post cystoprostatectomy 8 history of cataracts.  Glaucoma.  Patient is hard of hearing. Review of diabetes.  PAST SURGICAL HISTORY: As mentioned aboVE FAMILY HISTORY NO FAMILY HISTORY OF BREAST CANCER, COLON CANCER, OVARIAN CANCER      ADVANCED DIRECTIVES:  does have advanced healthcare directive  HEALTH MAINTENANCE: History  Substance Use Topics  . Smoking status: Former Smoker    Quit date: 01/10/1981  . Smokeless tobacco: Not on file  . Alcohol Use: Not on file      Allergies  Allergen Reactions  . Amoxicillin-Pot Clavulanate Nausea Only  . Ciprofloxacin Nausea Only    Current Outpatient Prescriptions  Medication Sig Dispense Refill  . amLODipine (NORVASC) 5 MG tablet     . atorvastatin (LIPITOR) 20 MG tablet     . bimatoprost (LUMIGAN) 0.03 % ophthalmic solution Frequency:QHS   Dosage:0.0     Instructions:  Note:Dose: 0.03 %    .  dorzolamide-timolol (COSOPT) 22.3-6.8 MG/ML ophthalmic solution     . KLOR-CON M10 10 MEQ tablet     . lisinopril-hydrochlorothiazide (PRINZIDE,ZESTORETIC) 20-12.5 MG per tablet     . pantoprazole (PROTONIX) 40 MG tablet     . glyBURIDE-metformin (GLUCOVANCE) 5-500 MG per tablet     . LUMIGAN 0.01 % SOLN      No current facility-administered medications for this visit.    OBJECTIVE: PHYSICAL  EXAM: GENERAL:  Well developed, well nourished, sitting comfortably in the exam room in no acute distress. MENTAL STATUS:  Alert and oriented to person, place and time. HEAD: alopecia.  Normocephalic, atraumatic, face symmetric, no Cushingoid features. EYES:  .  Pupils equal round and reactive to light and accomodation.  No conjunctivitis or scleral icterus. ENT:  Oropharynx clear without lesion.  Tongue normal. Mucous membranes moist.  RESPIRATORY:  Emphysematous chest.  Diminished air entry on both sides.  Occasional rhonchi on the right side CARDIOVASCULAR:  Regular rate and rhythm without murmur, rub or gallop. BREAST:  Right breast without masses, skin changes or nipple discharge.  Left breast without masses, skin changes or nipple discharge. ABDOMEN:  Soft, non-tender, with active bowel sounds, and no hepatosplenomegaly.  No masses. BACK:  No CVA tenderness.  No tenderness on percussion of the back or rib cage. SKIN:  No rashes, ulcers or lesions. EXTREMITIES: No edema, no skin discoloration or tenderness.  No palpable cords. LYMPH NODES: No palpable cervical, supraclavicular, axillary or inguinal adenopathy  NEUROLOGICAL: Unremarkable. PSYCH:  Appropriate.  Filed Vitals:   03/04/15 1103  BP: 143/81  Pulse: 61  Temp: 98.5 F (36.9 C)     Body mass index is 26.42 kg/(m^2).    ECOG FS:1 - Symptomatic but completely ambulatory  LAB RESULTS:  No visits with results within 3 Day(s) from this visit. Latest known visit with results is:  Admission on 02/20/2015, Discharged on 02/20/2015  Component Date Value Ref Range Status  . Glucose-Capillary 02/20/2015 163* 65 - 99 mg/dL Final  . Glucose-Capillary 02/20/2015 119* 65 - 99 mg/dL Final  . CYTOLOGY - NON GYN 02/20/2015    Final                   Value:Cytology - Non PAP CASE: [id number] PATIENT: William Mullen Non-Gyn Cytology Report     SPECIMEN SUBMITTED: A. FNA, lower paratracheal, right  CLINICAL HISTORY: None  provided  PRE-OPERATIVE DIAGNOSIS: None Provided  POST-OPERATIVE DIAGNOSIS: None Provided     DIAGNOSIS: A. LOWER PARATRACHEAL LESION, RIGHT; EBUS GUIDED FINE-NEEDLE ASPIRATION: - MALIGNANT NEOPLASM, FAVOR SMALL CELL CARCINOMA (SEE COMMENT).  Comment:  A panel of immunohistochemical stains was performed with the differential diagnosis being lymphoma, small cell carcinoma, or poorly differentiated carcinoma NOS. The results are summarized below and support the above diagnosis: Negative - pancytokeratin, CD3, CD20, CD138, TTF1, p40, CD45 Positive - CD56 The control slides worked appropriately. The interpretation includes evaluation of a cell block.   GROSS DESCRIPTION: A. Immediate Assessment: Procedure: EBUS Cytotechnologist: Mason Jim  Site: Right lower p                         aratracheal Pass 1: Blood predominantly Site: Right lower paratracheal Pass 2: Blood predominantly Site: Right lower paratracheal Pass 3: Lesional tissue present Site: Right lower paratracheal Pass 4: Lesional tissue present  Communicated to Dr. Mortimer Fries by Dr. Luana Shu on 02/20/2015.  Material submitted: 5 # Diff quick stained slides 4 # Pap stained  slides No Saline needle rinses in Cytolyte for Thin Prep No Flow cytometry in RPMI   Gross Description: 30 mL of hemorrhagic fluid with multiple fragments of pink red soft tissue. Submitted for cell block in 2 cassettes    Final Diagnosis performed by Delorse Lek, MD.  Electronically signed 03/03/2015 11:36:01AM    The electronic signature indicates that the named Attending Pathologist has evaluated the specimen  Technical component performed at [facility], [address] [place], [place] [address] Lab: [phone] Dir: Darrick Penna. Evette Doffing, MD  Professional component performed at [facility], [facility],                          [place], [place], [place] [address] Lab: [phone] Dir: Dellia Nims. Rubinas,  MD     Outside lab dated January 16, 2015 Glucose 127. Sodium 139  Potassium 4.3 creatinine 1.1  Liver enzymes are normal   STUDIES: Nm Pet Image Initial (pi) Skull Base To Thigh  02/11/2015   CLINICAL DATA:  Initial treatment strategy for right lung cancer.  EXAM: NUCLEAR MEDICINE PET SKULL BASE TO THIGH  TECHNIQUE: 12.49 mCi F-18 FDG was injected intravenously. Full-ring PET imaging was performed from the skull base to thigh after the radiotracer. CT data was obtained and used for attenuation correction and anatomic localization.  FASTING BLOOD GLUCOSE:  Value: 135 mg/dl  COMPARISON:  CT chest dated 01/22/2015.  PET-CT dated 12/03/2013.  FINDINGS: NECK  5 mm short axis right supraclavicular node (series 3/image 84), max SUV 4.2.  CHEST  Evaluation of the run parenchyma is constrained by respiratory motion.  3.3 x 1.8 cm spiculated right upper lobe opacity (series 3/ image 80), max SUV 9.2, suspicious for primary bronchogenic neoplasm.  Associated patchy ground-glass opacity in the central right upper lobe (series 3/image 80), max SUV 6.5, possibly reflecting endobronchial spread of tumor or infection.  Associated 1.9 x 1.7 cm central perihilar nodule (series 3/ image 84), max SUV 7.5, worrisome for a perihilar nodal metastasis.  8 mm subpleural nodule inferiorly in the right middle lobe (series 3/image 121), max SUV 8.4.  Extensive thoracic nodal metastases, including:  --1.6 cm short axis high right paratracheal node (series 3/image 73), max SUV 12.3  --1.8 cm short axis right paratracheal node (series 3/ image 82), max SUV 14.0  --2.8 cm right suprahilar node (series 3/image 94), max SUV 10.8  --9 mm subcarinal node (series 3/ image 95), max SUV 8.1  ABDOMEN/PELVIS  No abnormal hypermetabolic activity within the liver, pancreas, adrenal glands, or spleen.  No hypermetabolic lymph nodes in the abdomen or pelvis.  Left renal scarring/atrophy. Postsurgical changes related to prior ventral hernia mesh  repair. Status post cystectomy with right lower quadrant urostomy. Atherosclerotic calcifications of the abdominal aorta and branch vessels.  SKELETON  No focal hypermetabolic activity to suggest skeletal metastasis.  IMPRESSION: 3.3 x 1.8 cm hypermetabolic spiculated right upper lobe mass, suspicious for primary bronchogenic neoplasm.  Suspected 8 mm pulmonary metastasis in the inferior right middle lobe.  Associated right perihilar, mediastinal, and right supraclavicular nodal metastases.  No findings suspicious for metastatic disease in the abdomen/pelvis.   Electronically Signed   By: Julian Hy M.D.   On: 02/11/2015 16:00    ASSESSMENT:  abnormal CT scan and a PET scan.    CT scan and PET scan is been reviewed independently Patient has a right upper lobe nodule 3.8 cm.  Multiple mediastinal and hilar lymphadenopathy.  PLAN:  Pathology has been reviewed. I had detailed discussion regarding Patient diagnosis and treatment consideration.  Possibility of chemotherapy with carboplatinum and VP-16 ending patient has good response radiation therapy for consultation has been discussed.  Patient probably will not able to tolerate chemoradiation is a concurrent chemotherapy and radiation treatment because of his age.  Intent of chemotherapy is palliation and relief in symptoms and extending survivalAll the side effects of chemotherapy including myelosuppression, alopecia, nausea vomiting fatigue weakness.  Secondary infection, and   peripheral neuropathy .  Has been discussed in details. Informal consent has been obtained and will be documented by nurses in the chart Patient will need port placement.  An chemotherapy class. Patient and family something about all these options and will decide  Patient expressed understanding and was in agreement with this plan. He also understands that He can call clinic at any time with any questions, concerns, or complaints.   Total duration of visit was60  minutes.  50% or more time was spent in counseling patient and family regarding prognosis and options of treatment and available resources No matching staging information was found for the patient.  Forest Gleason, MD   03/04/2015 8:53 PM

## 2015-03-04 NOTE — Progress Notes (Signed)
Patient former smoker.  Does have living will. Patient states he does not feel well.  Also c/o decreased appetite.

## 2015-03-06 NOTE — Patient Instructions (Signed)
Etoposide, VP-16 injection  What is this medicine?  ETOPOSIDE, VP-16 (e toe POE side) is a chemotherapy drug. It is used to treat testicular cancer, lung cancer, and other cancers.  This medicine may be used for other purposes; ask your health care provider or pharmacist if you have questions.  COMMON BRAND NAME(S): Etopophos, Toposar, VePesid  What should I tell my health care provider before I take this medicine?  They need to know if you have any of these conditions:  -infection  -kidney disease  -low blood counts, like low white cell, platelet, or red cell counts  -an unusual or allergic reaction to etoposide, other chemotherapeutic agents, other medicines, foods, dyes, or preservatives  -pregnant or trying to get pregnant  -breast-feeding  How should I use this medicine?  This medicine is for infusion into a vein. It is administered in a hospital or clinic by a specially trained health care professional.  Talk to your pediatrician regarding the use of this medicine in children. Special care may be needed.  Overdosage: If you think you have taken too much of this medicine contact a poison control center or emergency room at once.  NOTE: This medicine is only for you. Do not share this medicine with others.  What if I miss a dose?  It is important not to miss your dose. Call your doctor or health care professional if you are unable to keep an appointment.  What may interact with this medicine?  -cyclosporine  -medicines to increase blood counts like filgrastim, pegfilgrastim, sargramostim  -vaccines  This list may not describe all possible interactions. Give your health care provider a list of all the medicines, herbs, non-prescription drugs, or dietary supplements you use. Also tell them if you smoke, drink alcohol, or use illegal drugs. Some items may interact with your medicine.  What should I watch for while using this medicine?  Visit your doctor for checks on your progress. This drug may make you feel  generally unwell. This is not uncommon, as chemotherapy can affect healthy cells as well as cancer cells. Report any side effects. Continue your course of treatment even though you feel ill unless your doctor tells you to stop.  In some cases, you may be given additional medicines to help with side effects. Follow all directions for their use.  Call your doctor or health care professional for advice if you get a fever, chills or sore throat, or other symptoms of a cold or flu. Do not treat yourself. This drug decreases your body's ability to fight infections. Try to avoid being around people who are sick.  This medicine may increase your risk to bruise or bleed. Call your doctor or health care professional if you notice any unusual bleeding.  Be careful brushing and flossing your teeth or using a toothpick because you may get an infection or bleed more easily. If you have any dental work done, tell your dentist you are receiving this medicine.  Avoid taking products that contain aspirin, acetaminophen, ibuprofen, naproxen, or ketoprofen unless instructed by your doctor. These medicines may hide a fever.  Do not become pregnant while taking this medicine. Women should inform their doctor if they wish to become pregnant or think they might be pregnant. There is a potential for serious side effects to an unborn child. Talk to your health care professional or pharmacist for more information. Do not breast-feed an infant while taking this medicine.  What side effects may I notice from receiving this   medicine?  Side effects that you should report to your doctor or health care professional as soon as possible:  -allergic reactions like skin rash, itching or hives, swelling of the face, lips, or tongue  -low blood counts - this medicine may decrease the number of white blood cells, red blood cells and platelets. You may be at increased risk for infections and bleeding.  -signs of infection - fever or chills, cough, sore  throat, pain or difficulty passing urine  -signs of decreased platelets or bleeding - bruising, pinpoint red spots on the skin, black, tarry stools, blood in the urine  -signs of decreased red blood cells - unusually weak or tired, fainting spells, lightheadedness  -breathing problems  -changes in vision  -mouth or throat sores or ulcers  -pain, redness, swelling or irritation at the injection site  -pain, tingling, numbness in the hands or feet  -redness, blistering, peeling or loosening of the skin, including inside the mouth  -seizures  -vomiting  Side effects that usually do not require medical attention (report to your doctor or health care professional if they continue or are bothersome):  -diarrhea  -hair loss  -loss of appetite  -nausea  -stomach pain  This list may not describe all possible side effects. Call your doctor for medical advice about side effects. You may report side effects to FDA at 1-800-FDA-1088.  Where should I keep my medicine?  This drug is given in a hospital or clinic and will not be stored at home.  NOTE: This sheet is a summary. It may not cover all possible information. If you have questions about this medicine, talk to your doctor, pharmacist, or health care provider.   2015, Elsevier/Gold Standard. (2008-01-15 17:24:12)  Carboplatin injection  What is this medicine?  CARBOPLATIN (KAR boe pla tin) is a chemotherapy drug. It targets fast dividing cells, like cancer cells, and causes these cells to die. This medicine is used to treat ovarian cancer and many other cancers.  This medicine may be used for other purposes; ask your health care provider or pharmacist if you have questions.  COMMON BRAND NAME(S): Paraplatin  What should I tell my health care provider before I take this medicine?  They need to know if you have any of these conditions:  -blood disorders  -hearing problems  -kidney disease  -recent or ongoing radiation therapy  -an unusual or allergic reaction to carboplatin,  cisplatin, other chemotherapy, other medicines, foods, dyes, or preservatives  -pregnant or trying to get pregnant  -breast-feeding  How should I use this medicine?  This drug is usually given as an infusion into a vein. It is administered in a hospital or clinic by a specially trained health care professional.  Talk to your pediatrician regarding the use of this medicine in children. Special care may be needed.  Overdosage: If you think you have taken too much of this medicine contact a poison control center or emergency room at once.  NOTE: This medicine is only for you. Do not share this medicine with others.  What if I miss a dose?  It is important not to miss a dose. Call your doctor or health care professional if you are unable to keep an appointment.  What may interact with this medicine?  -medicines for seizures  -medicines to increase blood counts like filgrastim, pegfilgrastim, sargramostim  -some antibiotics like amikacin, gentamicin, neomycin, streptomycin, tobramycin  -vaccines  Talk to your doctor or health care professional before taking any of these   medicines:  -acetaminophen  -aspirin  -ibuprofen  -ketoprofen  -naproxen  This list may not describe all possible interactions. Give your health care provider a list of all the medicines, herbs, non-prescription drugs, or dietary supplements you use. Also tell them if you smoke, drink alcohol, or use illegal drugs. Some items may interact with your medicine.  What should I watch for while using this medicine?  Your condition will be monitored carefully while you are receiving this medicine. You will need important blood work done while you are taking this medicine.  This drug may make you feel generally unwell. This is not uncommon, as chemotherapy can affect healthy cells as well as cancer cells. Report any side effects. Continue your course of treatment even though you feel ill unless your doctor tells you to stop.  In some cases, you may be given  additional medicines to help with side effects. Follow all directions for their use.  Call your doctor or health care professional for advice if you get a fever, chills or sore throat, or other symptoms of a cold or flu. Do not treat yourself. This drug decreases your body's ability to fight infections. Try to avoid being around people who are sick.  This medicine may increase your risk to bruise or bleed. Call your doctor or health care professional if you notice any unusual bleeding.  Be careful brushing and flossing your teeth or using a toothpick because you may get an infection or bleed more easily. If you have any dental work done, tell your dentist you are receiving this medicine.  Avoid taking products that contain aspirin, acetaminophen, ibuprofen, naproxen, or ketoprofen unless instructed by your doctor. These medicines may hide a fever.  Do not become pregnant while taking this medicine. Women should inform their doctor if they wish to become pregnant or think they might be pregnant. There is a potential for serious side effects to an unborn child. Talk to your health care professional or pharmacist for more information. Do not breast-feed an infant while taking this medicine.  What side effects may I notice from receiving this medicine?  Side effects that you should report to your doctor or health care professional as soon as possible:  -allergic reactions like skin rash, itching or hives, swelling of the face, lips, or tongue  -signs of infection - fever or chills, cough, sore throat, pain or difficulty passing urine  -signs of decreased platelets or bleeding - bruising, pinpoint red spots on the skin, black, tarry stools, nosebleeds  -signs of decreased red blood cells - unusually weak or tired, fainting spells, lightheadedness  -breathing problems  -changes in hearing  -changes in vision  -chest pain  -high blood pressure  -low blood counts - This drug may decrease the number of white blood cells, red  blood cells and platelets. You may be at increased risk for infections and bleeding.  -nausea and vomiting  -pain, swelling, redness or irritation at the injection site  -pain, tingling, numbness in the hands or feet  -problems with balance, talking, walking  -trouble passing urine or change in the amount of urine  Side effects that usually do not require medical attention (report to your doctor or health care professional if they continue or are bothersome):  -hair loss  -loss of appetite  -metallic taste in the mouth or changes in taste  This list may not describe all possible side effects. Call your doctor for medical advice about side effects. You may report side

## 2015-03-07 ENCOUNTER — Telehealth: Payer: Self-pay | Admitting: *Deleted

## 2015-03-07 NOTE — Telephone Encounter (Signed)
Deferred to PMD as he has not started tx yet

## 2015-03-10 NOTE — Progress Notes (Unsigned)
PSN called patient today to discuss the assisted living process.  Patient reported that he has already found a facility and will be moving in the near future.  He will be moving to [facility].

## 2015-03-11 ENCOUNTER — Other Ambulatory Visit: Payer: Self-pay | Admitting: Internal Medicine

## 2015-03-11 ENCOUNTER — Ambulatory Visit
Admission: RE | Admit: 2015-03-11 | Discharge: 2015-03-11 | Disposition: A | Discharge status: Home / Self Care | Payer: Medicare Other | Source: Ambulatory Visit | Attending: Internal Medicine | Admitting: Internal Medicine

## 2015-03-11 ENCOUNTER — Inpatient Hospital Stay: Payer: Medicare Other

## 2015-03-11 DIAGNOSIS — R270 Ataxia, unspecified: Secondary | ICD-10-CM | POA: Insufficient documentation

## 2015-03-11 DIAGNOSIS — C3491 Malignant neoplasm of unspecified part of right bronchus or lung: Secondary | ICD-10-CM

## 2015-03-11 DIAGNOSIS — M542 Cervicalgia: Secondary | ICD-10-CM

## 2015-03-11 DIAGNOSIS — I878 Other specified disorders of veins: Secondary | ICD-10-CM | POA: Diagnosis not present

## 2015-03-11 DIAGNOSIS — J3801 Paralysis of vocal cords and larynx, unilateral: Secondary | ICD-10-CM | POA: Diagnosis not present

## 2015-03-11 HISTORY — DX: Type 2 diabetes mellitus without complications: E11.9

## 2015-03-11 HISTORY — DX: Essential (primary) hypertension: I10

## 2015-03-11 HISTORY — DX: Disorder of kidney and ureter, unspecified: N28.9

## 2015-03-11 HISTORY — DX: Malignant neoplasm of prostate: C61

## 2015-03-11 MED ORDER — IOHEXOL 300 MG/ML  SOLN
75.0000 mL | Freq: Once | INTRAMUSCULAR | Status: AC | PRN
  Administered 2015-03-11: 75 mL via INTRAVENOUS

## 2015-03-14 ENCOUNTER — Encounter: Payer: Self-pay | Admitting: *Deleted

## 2015-03-14 ENCOUNTER — Encounter
Admission: RE | Disposition: A | Discharge status: Home / Self Care | Payer: Self-pay | Source: Ambulatory Visit | Attending: Vascular Surgery

## 2015-03-14 ENCOUNTER — Ambulatory Visit
Admission: RE | Admit: 2015-03-14 | Discharge: 2015-03-14 | Disposition: A | Discharge status: Home / Self Care | Payer: Medicare Other | Source: Ambulatory Visit | Attending: Vascular Surgery | Admitting: Vascular Surgery

## 2015-03-14 DIAGNOSIS — C3491 Malignant neoplasm of unspecified part of right bronchus or lung: Secondary | ICD-10-CM | POA: Diagnosis present

## 2015-03-14 DIAGNOSIS — Z87891 Personal history of nicotine dependence: Secondary | ICD-10-CM | POA: Insufficient documentation

## 2015-03-14 HISTORY — PX: PERIPHERAL VASCULAR CATHETERIZATION: SHX172C

## 2015-03-14 HISTORY — DX: Unspecified osteoarthritis, unspecified site: M19.90

## 2015-03-14 HISTORY — DX: Gastrojejunal ulcer, unspecified as acute or chronic, without hemorrhage or perforation: K28.9

## 2015-03-14 HISTORY — DX: Pneumonia, unspecified organism: J18.9

## 2015-03-14 HISTORY — DX: Malignant neoplasm of unspecified part of unspecified bronchus or lung: C34.90

## 2015-03-14 LAB — COMPREHENSIVE METABOLIC PANEL
ALT: 11 U/L — ABNORMAL LOW (ref 17–63)
AST: 16 U/L (ref 15–41)
Albumin: 3.9 g/dL (ref 3.5–5.0)
Alkaline Phosphatase: 70 U/L (ref 38–126)
Anion gap: 6 (ref 5–15)
BUN: 23 mg/dL — ABNORMAL HIGH (ref 6–20)
CO2: 30 mmol/L (ref 22–32)
Calcium: 8.9 mg/dL (ref 8.9–10.3)
Chloride: 101 mmol/L (ref 101–111)
Creatinine, Ser: 1.25 mg/dL — ABNORMAL HIGH (ref 0.61–1.24)
GFR calc non Af Amer: >60 mL/min (ref >60)
GFR, EST AFRICAN AMERICAN: 58 mL/min — AB (ref >60)
GLUCOSE: 135 mg/dL — AB (ref 65–99)
Potassium: 4.1 mmol/L (ref 3.5–5.1)
Sodium: 137 mmol/L (ref 135–145)
Total Bilirubin: 0.2 mg/dL — ABNORMAL LOW (ref 0.3–1.2)
Total Protein: 7 g/dL (ref 6.5–8.1)

## 2015-03-14 LAB — GLUCOSE, CAPILLARY: GLUCOSE-CAPILLARY: 138 mg/dL — AB (ref 65–99)

## 2015-03-14 SURGERY — PORTA CATH INSERTION
Anesthesia: Moderate Sedation

## 2015-03-14 MED ORDER — ONDANSETRON HCL 4 MG/2ML IJ SOLN: 4.0000 mg | Freq: Four times a day (QID) | INTRAMUSCULAR | Status: DC | PRN

## 2015-03-14 MED ORDER — HEPARIN (PORCINE) IN NACL 2-0.9 UNIT/ML-% IJ SOLN
INTRAMUSCULAR | Status: AC
  Filled 2015-03-14: qty 500

## 2015-03-14 MED ORDER — MIDAZOLAM HCL 2 MG/2ML IJ SOLN
INTRAMUSCULAR | Status: DC | PRN
  Administered 2015-03-14: 2 mg via INTRAVENOUS
  Administered 2015-03-14: 1 mg via INTRAVENOUS
  Administered 2015-03-14: 0.5 mg via INTRAVENOUS

## 2015-03-14 MED ORDER — LIDOCAINE-EPINEPHRINE (PF) 1 %-1:200000 IJ SOLN
INTRAMUSCULAR | Status: AC
  Filled 2015-03-14: qty 30

## 2015-03-14 MED ORDER — ACETAMINOPHEN 325 MG PO TABS: 325.0000 mg | ORAL_TABLET | ORAL | Status: DC | PRN

## 2015-03-14 MED ORDER — CLINDAMYCIN PHOSPHATE 300 MG/50ML IV SOLN
300.0000 mg | Freq: Once | INTRAVENOUS | Status: AC
  Administered 2015-03-14: 300 mg via INTRAVENOUS

## 2015-03-14 MED ORDER — METOPROLOL TARTRATE 1 MG/ML IV SOLN: 2.0000 mg | INTRAVENOUS | Status: DC | PRN

## 2015-03-14 MED ORDER — FENTANYL CITRATE (PF) 100 MCG/2ML IJ SOLN
INTRAMUSCULAR | Status: DC | PRN
  Administered 2015-03-14: 50 ug via INTRAVENOUS
  Administered 2015-03-14 (×2): 25 ug via INTRAVENOUS

## 2015-03-14 MED ORDER — FENTANYL CITRATE (PF) 100 MCG/2ML IJ SOLN
INTRAMUSCULAR | Status: AC
  Filled 2015-03-14: qty 2

## 2015-03-14 MED ORDER — MIDAZOLAM HCL 5 MG/5ML IJ SOLN
INTRAMUSCULAR | Status: AC
  Filled 2015-03-14: qty 5

## 2015-03-14 MED ORDER — ACETAMINOPHEN 325 MG RE SUPP: 325.0000 mg | RECTAL | Status: DC | PRN

## 2015-03-14 MED ORDER — OXYCODONE HCL 5 MG PO TABS: 5.0000 mg | ORAL_TABLET | ORAL | Status: DC | PRN

## 2015-03-14 MED ORDER — CEFAZOLIN SODIUM 1-5 GM-% IV SOLN
INTRAVENOUS | Status: AC
  Filled 2015-03-14: qty 50

## 2015-03-14 MED ORDER — ALUM & MAG HYDROXIDE-SIMETH 200-200-20 MG/5ML PO SUSP: 15.0000 mL | ORAL | Status: DC | PRN

## 2015-03-14 MED ORDER — SODIUM CHLORIDE 0.9 % IR SOLN
Freq: Once | Status: DC
  Filled 2015-03-14: qty 2

## 2015-03-14 MED ORDER — LABETALOL HCL 5 MG/ML IV SOLN: 10.0000 mg | INTRAVENOUS | Status: DC | PRN

## 2015-03-14 MED ORDER — HYDRALAZINE HCL 20 MG/ML IJ SOLN: 5.0000 mg | INTRAMUSCULAR | Status: DC | PRN

## 2015-03-14 MED ORDER — CLINDAMYCIN PHOSPHATE 300 MG/50ML IV SOLN
INTRAVENOUS | Status: AC
  Filled 2015-03-14: qty 50

## 2015-03-14 MED ORDER — GUAIFENESIN-DM 100-10 MG/5ML PO SYRP: 15.0000 mL | ORAL_SOLUTION | ORAL | Status: DC | PRN

## 2015-03-14 MED ORDER — SODIUM CHLORIDE 0.9 % IV SOLN
INTRAVENOUS | Status: DC
  Administered 2015-03-14: 08:00:00 via INTRAVENOUS

## 2015-03-14 MED ORDER — PHENOL 1.4 % MT LIQD: 1.0000 | OROMUCOSAL | Status: DC | PRN

## 2015-03-14 MED ORDER — PANTOPRAZOLE SODIUM 40 MG PO TBEC: 40.0000 mg | DELAYED_RELEASE_TABLET | Freq: Every day | ORAL | Status: DC

## 2015-03-14 SURGICAL SUPPLY — 3 items
PACK ANGIOGRAPHY (CUSTOM PROCEDURE TRAY) ×3 IMPLANT
PORTACATH POWER 8F (Port) ×3 IMPLANT
TOWEL OR 17X26 4PK STRL BLUE (TOWEL DISPOSABLE) ×3 IMPLANT

## 2015-03-14 NOTE — H&P (Signed)
 [facility] History & Physical Update  The patient was interviewed and re-examined.  The patient's previous History and Physical has been reviewed and is unchanged.  There is no change in the plan of care.  Kylii Ennis, Dolores Lory, MD  03/14/2015, 9:11 AM

## 2015-03-17 ENCOUNTER — Encounter: Payer: Self-pay | Admitting: Vascular Surgery

## 2015-03-18 ENCOUNTER — Other Ambulatory Visit: Payer: Self-pay | Admitting: *Deleted

## 2015-03-18 ENCOUNTER — Ambulatory Visit: Payer: Medicare Other

## 2015-03-18 DIAGNOSIS — C3491 Malignant neoplasm of unspecified part of right bronchus or lung: Secondary | ICD-10-CM

## 2015-03-18 MED ORDER — LIDOCAINE-PRILOCAINE 2.5-2.5 % EX CREA: 1.0000 "application " | TOPICAL_CREAM | CUTANEOUS | Status: DC | PRN

## 2015-03-23 ENCOUNTER — Emergency Department: Payer: Medicare Other

## 2015-03-23 ENCOUNTER — Encounter: Payer: Self-pay | Admitting: Emergency Medicine

## 2015-03-23 ENCOUNTER — Inpatient Hospital Stay
Admission: EM | Admit: 2015-03-23 | Discharge: 2015-03-26 | DRG: 180 | Disposition: A | Discharge status: Hospice | Payer: Medicare Other | Attending: Internal Medicine | Admitting: Internal Medicine

## 2015-03-23 DIAGNOSIS — I129 Hypertensive chronic kidney disease with stage 1 through stage 4 chronic kidney disease, or unspecified chronic kidney disease: Secondary | ICD-10-CM | POA: Diagnosis present

## 2015-03-23 DIAGNOSIS — E119 Type 2 diabetes mellitus without complications: Secondary | ICD-10-CM | POA: Diagnosis present

## 2015-03-23 DIAGNOSIS — Z8546 Personal history of malignant neoplasm of prostate: Secondary | ICD-10-CM | POA: Diagnosis not present

## 2015-03-23 DIAGNOSIS — Z66 Do not resuscitate: Secondary | ICD-10-CM | POA: Diagnosis present

## 2015-03-23 DIAGNOSIS — E785 Hyperlipidemia, unspecified: Secondary | ICD-10-CM | POA: Diagnosis present

## 2015-03-23 DIAGNOSIS — Z87891 Personal history of nicotine dependence: Secondary | ICD-10-CM

## 2015-03-23 DIAGNOSIS — Z8551 Personal history of malignant neoplasm of bladder: Secondary | ICD-10-CM

## 2015-03-23 DIAGNOSIS — E43 Unspecified severe protein-calorie malnutrition: Secondary | ICD-10-CM | POA: Diagnosis present

## 2015-03-23 DIAGNOSIS — H409 Unspecified glaucoma: Secondary | ICD-10-CM | POA: Diagnosis present

## 2015-03-23 DIAGNOSIS — R630 Anorexia: Secondary | ICD-10-CM | POA: Diagnosis not present

## 2015-03-23 DIAGNOSIS — J910 Malignant pleural effusion: Secondary | ICD-10-CM | POA: Diagnosis present

## 2015-03-23 DIAGNOSIS — C3411 Malignant neoplasm of upper lobe, right bronchus or lung: Principal | ICD-10-CM | POA: Diagnosis present

## 2015-03-23 DIAGNOSIS — N183 Chronic kidney disease, stage 3 (moderate): Secondary | ICD-10-CM | POA: Diagnosis present

## 2015-03-23 DIAGNOSIS — M1990 Unspecified osteoarthritis, unspecified site: Secondary | ICD-10-CM | POA: Diagnosis present

## 2015-03-23 DIAGNOSIS — J9600 Acute respiratory failure, unspecified whether with hypoxia or hypercapnia: Secondary | ICD-10-CM | POA: Diagnosis present

## 2015-03-23 DIAGNOSIS — Z8711 Personal history of peptic ulcer disease: Secondary | ICD-10-CM

## 2015-03-23 DIAGNOSIS — Z8249 Family history of ischemic heart disease and other diseases of the circulatory system: Secondary | ICD-10-CM | POA: Diagnosis not present

## 2015-03-23 DIAGNOSIS — H9190 Unspecified hearing loss, unspecified ear: Secondary | ICD-10-CM | POA: Diagnosis present

## 2015-03-23 DIAGNOSIS — Z87440 Personal history of urinary (tract) infections: Secondary | ICD-10-CM | POA: Diagnosis not present

## 2015-03-23 DIAGNOSIS — R918 Other nonspecific abnormal finding of lung field: Secondary | ICD-10-CM

## 2015-03-23 DIAGNOSIS — E860 Dehydration: Secondary | ICD-10-CM | POA: Diagnosis present

## 2015-03-23 DIAGNOSIS — Z515 Encounter for palliative care: Secondary | ICD-10-CM

## 2015-03-23 DIAGNOSIS — J90 Pleural effusion, not elsewhere classified: Secondary | ICD-10-CM | POA: Diagnosis present

## 2015-03-23 DIAGNOSIS — Z9889 Other specified postprocedural states: Secondary | ICD-10-CM

## 2015-03-23 DIAGNOSIS — N179 Acute kidney failure, unspecified: Secondary | ICD-10-CM | POA: Diagnosis present

## 2015-03-23 DIAGNOSIS — C3490 Malignant neoplasm of unspecified part of unspecified bronchus or lung: Secondary | ICD-10-CM | POA: Diagnosis present

## 2015-03-23 DIAGNOSIS — C3491 Malignant neoplasm of unspecified part of right bronchus or lung: Secondary | ICD-10-CM | POA: Diagnosis not present

## 2015-03-23 DIAGNOSIS — J948 Other specified pleural conditions: Secondary | ICD-10-CM | POA: Diagnosis not present

## 2015-03-23 LAB — CBC
HEMATOCRIT: 42.2 % (ref 40.0–52.0)
HEMOGLOBIN: 13.6 g/dL (ref 13.0–18.0)
MCH: 27.4 pg (ref 26.0–34.0)
MCHC: 32.3 g/dL (ref 32.0–36.0)
MCV: 84.7 fL (ref 80.0–100.0)
PLATELETS: 348 10*3/uL (ref 150–440)
RBC: 4.99 MIL/uL (ref 4.40–5.90)
RDW: 15.1 % — AB (ref 11.5–14.5)
WBC: 11.4 10*3/uL — ABNORMAL HIGH (ref 3.8–10.6)

## 2015-03-23 LAB — BASIC METABOLIC PANEL
Anion gap: 11 (ref 5–15)
BUN: 56 mg/dL — ABNORMAL HIGH (ref 6–20)
CO2: 23 mmol/L (ref 22–32)
CREATININE: 1.52 mg/dL — AB (ref 0.61–1.24)
Calcium: 9.1 mg/dL (ref 8.9–10.3)
Chloride: 102 mmol/L (ref 101–111)
GFR calc Af Amer: 45 mL/min — ABNORMAL LOW (ref >60)
GFR, EST NON AFRICAN AMERICAN: 39 mL/min — AB (ref >60)
Glucose, Bld: 163 mg/dL — ABNORMAL HIGH (ref 65–99)
Potassium: 4.8 mmol/L (ref 3.5–5.1)
Sodium: 136 mmol/L (ref 135–145)

## 2015-03-23 LAB — TROPONIN I

## 2015-03-23 LAB — GLUCOSE, CAPILLARY
GLUCOSE-CAPILLARY: 101 mg/dL — AB (ref 65–99)
Glucose-Capillary: 149 mg/dL — ABNORMAL HIGH (ref 65–99)

## 2015-03-23 LAB — MRSA PCR SCREENING: MRSA BY PCR: NEGATIVE

## 2015-03-23 MED ORDER — ACETAMINOPHEN 325 MG PO TABS
650.0000 mg | ORAL_TABLET | Freq: Four times a day (QID) | ORAL | Status: DC | PRN
  Administered 2015-03-23 – 2015-03-25 (×3): 650 mg via ORAL
  Filled 2015-03-23 (×2): qty 2

## 2015-03-23 MED ORDER — DORZOLAMIDE HCL-TIMOLOL MAL 2-0.5 % OP SOLN
1.0000 [drp] | Freq: Two times a day (BID) | OPHTHALMIC | Status: DC
  Administered 2015-03-23 – 2015-03-25 (×5): 1 [drp] via OPHTHALMIC
  Filled 2015-03-23: qty 10

## 2015-03-23 MED ORDER — AMLODIPINE BESYLATE 5 MG PO TABS: 5.0000 mg | ORAL_TABLET | Freq: Every day | ORAL | Status: DC

## 2015-03-23 MED ORDER — CEFTRIAXONE SODIUM IN DEXTROSE 20 MG/ML IV SOLN
INTRAVENOUS | Status: AC
  Administered 2015-03-23: 1 g via INTRAVENOUS
  Filled 2015-03-23: qty 50

## 2015-03-23 MED ORDER — PANTOPRAZOLE SODIUM 40 MG PO TBEC
40.0000 mg | DELAYED_RELEASE_TABLET | Freq: Two times a day (BID) | ORAL | Status: DC
  Administered 2015-03-23 – 2015-03-26 (×6): 40 mg via ORAL
  Filled 2015-03-23 (×6): qty 1

## 2015-03-23 MED ORDER — INSULIN ASPART 100 UNIT/ML ~~LOC~~ SOLN: 0.0000 [IU] | Freq: Every day | SUBCUTANEOUS | Status: DC

## 2015-03-23 MED ORDER — POLYETHYLENE GLYCOL 3350 17 G PO PACK: 17.0000 g | PACK | Freq: Every day | ORAL | Status: DC | PRN

## 2015-03-23 MED ORDER — ENSURE ENLIVE PO LIQD: 237.0000 mL | Freq: Two times a day (BID) | ORAL | Status: DC

## 2015-03-23 MED ORDER — ENSURE ENLIVE PO LIQD
237.0000 mL | Freq: Once | ORAL | Status: AC
  Administered 2015-03-23: 237 mL via ORAL

## 2015-03-23 MED ORDER — AZITHROMYCIN 250 MG PO TABS: 250.0000 mg | ORAL_TABLET | Freq: Every day | ORAL | Status: DC

## 2015-03-23 MED ORDER — SODIUM CHLORIDE 0.9 % IV SOLN
INTRAVENOUS | Status: DC
  Administered 2015-03-23 – 2015-03-25 (×3): via INTRAVENOUS

## 2015-03-23 MED ORDER — CEFTRIAXONE SODIUM IN DEXTROSE 20 MG/ML IV SOLN
1.0000 g | Freq: Once | INTRAVENOUS | Status: AC
  Administered 2015-03-23: 1 g via INTRAVENOUS

## 2015-03-23 MED ORDER — SODIUM CHLORIDE 0.9 % IV BOLUS (SEPSIS)
1000.0000 mL | Freq: Once | INTRAVENOUS | Status: AC
  Administered 2015-03-23: 1000 mL via INTRAVENOUS

## 2015-03-23 MED ORDER — CHLORHEXIDINE GLUCONATE 0.12 % MT SOLN
15.0000 mL | Freq: Two times a day (BID) | OROMUCOSAL | Status: DC
  Administered 2015-03-23 – 2015-03-26 (×3): 15 mL via OROMUCOSAL

## 2015-03-23 MED ORDER — OXYCODONE HCL 5 MG PO TABS
5.0000 mg | ORAL_TABLET | ORAL | Status: DC | PRN
  Administered 2015-03-23 – 2015-03-25 (×3): 5 mg via ORAL
  Filled 2015-03-23 (×3): qty 1

## 2015-03-23 MED ORDER — IPRATROPIUM-ALBUTEROL 0.5-2.5 (3) MG/3ML IN SOLN
3.0000 mL | Freq: Four times a day (QID) | RESPIRATORY_TRACT | Status: DC
  Administered 2015-03-23 – 2015-03-24 (×6): 3 mL via RESPIRATORY_TRACT
  Filled 2015-03-23 (×8): qty 3

## 2015-03-23 MED ORDER — CEFTRIAXONE SODIUM IN DEXTROSE 20 MG/ML IV SOLN
1.0000 g | INTRAVENOUS | Status: DC
  Administered 2015-03-24 – 2015-03-25 (×2): 1 g via INTRAVENOUS
  Filled 2015-03-23 (×3): qty 50

## 2015-03-23 MED ORDER — INSULIN ASPART 100 UNIT/ML ~~LOC~~ SOLN
0.0000 [IU] | Freq: Three times a day (TID) | SUBCUTANEOUS | Status: DC
  Administered 2015-03-24 – 2015-03-25 (×2): 2 [IU] via SUBCUTANEOUS
  Administered 2015-03-25 (×2): 1 [IU] via SUBCUTANEOUS
  Filled 2015-03-23 (×2): qty 1
  Filled 2015-03-23: qty 2
  Filled 2015-03-23 (×2): qty 1

## 2015-03-23 MED ORDER — ACETAMINOPHEN 325 MG PO TABS
ORAL_TABLET | ORAL | Status: AC
  Filled 2015-03-23: qty 2

## 2015-03-23 MED ORDER — ACETAMINOPHEN 650 MG RE SUPP: 650.0000 mg | Freq: Four times a day (QID) | RECTAL | Status: DC | PRN

## 2015-03-23 MED ORDER — CETYLPYRIDINIUM CHLORIDE 0.05 % MT LIQD
7.0000 mL | Freq: Two times a day (BID) | OROMUCOSAL | Status: DC
  Administered 2015-03-25: 7 mL via OROMUCOSAL

## 2015-03-23 MED ORDER — DEXTROSE 5 % IV SOLN
500.0000 mg | Freq: Once | INTRAVENOUS | Status: AC
  Administered 2015-03-23: 21:00:00 500 mg via INTRAVENOUS
  Filled 2015-03-23: qty 500

## 2015-03-23 MED ORDER — ATORVASTATIN CALCIUM 10 MG PO TABS
10.0000 mg | ORAL_TABLET | Freq: Every day | ORAL | Status: DC
  Administered 2015-03-23: 10 mg via ORAL
  Filled 2015-03-23: qty 1

## 2015-03-23 MED ORDER — LATANOPROST 0.005 % OP SOLN
1.0000 [drp] | Freq: Every day | OPHTHALMIC | Status: DC
  Administered 2015-03-23 – 2015-03-25 (×3): 1 [drp] via OPHTHALMIC
  Filled 2015-03-23: qty 2.5

## 2015-03-23 NOTE — ED Notes (Signed)
Pt with lung ca , to start chemo tomorrow, today just increased difficulty breathing x3 weeks, decreased appetite. Pt pale in appearance , speaks in unlabored respirations

## 2015-03-23 NOTE — Plan of Care (Signed)
Problem: Discharge Progression Outcomes Goal: Hemodynamically stable Outcome: Progressing Vital signs are stable, afebrile throughout shift, up to side of bed with stand by assist, bm in am at home, on room air, breathing treatment administered for SOB, denies hunger, provided with ensure, resting in bed quietly, c/o headache provided with oxycodone. Recently diagnosed with lung cancer, patient is followed by Dr. Jeb Levering, oncology consult entered.

## 2015-03-23 NOTE — H&P (Signed)
[facility] NAME: William Mullen    MR#:  [id number]  DATE OF BIRTH:  04/20/1923  DATE OF ADMISSION:  03/23/2015  PRIMARY CARE PHYSICIAN: Adin Hector, MD   REQUESTING/REFERRING PHYSICIAN: Lisa Roca, M.D.  CHIEF COMPLAINT:   Chief Complaint  Patient presents with  . Respiratory Distress    HISTORY OF PRESENT ILLNESS:  William Mullen  is a 81 y.o. male with a known history of recently diagnosed small cell lung cancer. He is supposed to start chemotherapy tomorrow with Dr. Oliva Bustard. He has been panting for his breath and can't breathe well. Poor appetite. He's been having right shoulder and neck pain and right-sided chest pain scaled as 2-3 out of 10 in intensity. Worse with trying to take a deep breath. He also has a headache in which Tylenol does help but not well enough. He's been very weak and can hardly stand on his own. He's been coughing constantly but can't bring up any phlegm. He was recently treated for urinary tract infection. He recently had a Port-A-Cath placed. In the ER he was found to have a new right pleural effusion. Hospitalist services were contacted for further evaluation.  PAST MEDICAL HISTORY:   Past Medical History  Diagnosis Date  . Cancer of right lung   . Carcinoma of right lung 02/19/2015  . Prostate ca   . Hypertension   . Diabetes mellitus without complication     Metformin  . Renal insufficiency   . Pneumonia   . Arthritis   . Lung cancer   . Gastrointestinal bleeding ulcer     PAST SURGICAL HISTORY:   Past Surgical History  Procedure Laterality Date  . Nose surgery      broken nose  . Urostomy for bladder cancer    . Toe surgery      broken toe  . Pancreatic cyst removal    . Gastrointestinal clips for bleeding ulcer    . Peripheral vascular catheterization N/A 03/14/2015    Procedure: Glori Luis Cath Insertion;  Surgeon: Katha Cabal, MD;  Location: [place] [facility];  Service: Cardiovascular;  Laterality: N/A;    SOCIAL HISTORY:   History  Substance Use Topics  . Smoking status: Former Smoker -- 2.00 packs/day    Quit date: 01/10/1981  . Smokeless tobacco: Not on file  . Alcohol Use: No    FAMILY HISTORY:   Family History  Problem Relation Age of Onset  . Osteoporosis Mother   . Hypertension Father     DRUG ALLERGIES:   Allergies  Allergen Reactions  . Amoxicillin-Pot Clavulanate Nausea Only  . Ciprofloxacin Nausea Only    REVIEW OF SYSTEMS:  CONSTITUTIONAL: No fever, positive for fatigue and weakness. Positive for night sweats. Positive for weight loss 10+ pounds over the past 2-3 weeks. EYES: Left eye very poor vision secondary to glaucoma. EARS, NOSE, AND THROAT: No tinnitus or ear pain. No sore throat. He does wear hearing aids. Positive for runny nose and postnasal drip. RESPIRATORY: Positive for cough and shortness of breath, no wheezing or hemoptysis. No sputum production CARDIOVASCULAR: Positive for right sided chest pain, no orthopnea, edema.  GASTROINTESTINAL: As per wife some nausea, no vomiting, positive for diarrhea after laxative. No abdominal pain. No blood in bowel movements.  GENITOURINARY: No hematuria.  ENDOCRINE: No polyuria, nocturia,  HEMATOLOGY: No anemia, easy bruising or bleeding SKIN: No rash or lesion. MUSCULOSKELETAL: No joint pain or arthritis.  NEUROLOGIC: No tingling, numbness. Felt like he was going to pass out. PSYCHIATRY: As per wife some anxiety , no depression.   MEDICATIONS AT HOME:   Prior to Admission medications   Medication Sig Start Date End Date Taking? Authorizing Provider  amLODipine (NORVASC) 5 MG tablet  01/19/15   Historical Provider, MD  atorvastatin (LIPITOR) 20 MG tablet 20 mg. 0.5 tablet every evening 02/08/15   Historical Provider, MD  bimatoprost (LUMIGAN) 0.03 % ophthalmic solution Frequency:QHS   Dosage:0.0     Instructions:  Note:Dose: 0.03 % 12/15/11    Historical Provider, MD  dorzolamide-timolol (COSOPT) 22.3-6.8 MG/ML ophthalmic solution  12/11/14   Historical Provider, MD  glyBURIDE-metformin (GLUCOVANCE) 5-500 MG per tablet  03/01/15   Historical Provider, MD  KLOR-CON M10 10 MEQ tablet  12/08/14   Historical Provider, MD  levofloxacin (LEVAQUIN) 500 MG tablet Take 500 mg by mouth daily.    Historical Provider, MD  lidocaine-prilocaine (EMLA) cream Apply 1 application topically as needed. 03/18/15   Forest Gleason, MD  lisinopril-hydrochlorothiazide (PRINZIDE,ZESTORETIC) 20-12.5 MG per tablet 1 tablet 2 (two) times daily.  01/20/15   Historical Provider, MD  LUMIGAN 0.01 % SOLN  11/29/14   Historical Provider, MD  pantoprazole (PROTONIX) 40 MG tablet 40 mg 2 (two) times daily.  12/02/14   Historical Provider, MD    Med rec still needs to be fully updated in the computer.  VITAL SIGNS:  Blood pressure 120/67, pulse 86, temperature 97.4 F (36.3 C), temperature source Oral, resp. rate 26, height '5\' 11"'$  (1.803 m), weight 82.101 kg (181 lb), SpO2 97 %.  PHYSICAL EXAMINATION:  GENERAL:  81 y.o.-year-old patient lying in the bed with no acute distress.  EYES: Pupils equal, round, reactive to light and accommodation. No scleral icterus. Extraocular muscles intact.  HEENT: Head atraumatic, normocephalic. Oropharynx and nasopharynx clear.  NECK:  Supple, no jugular venous distention. No thyroid enlargement, no tenderness.  LUNGS: Normal breath sounds bilaterally, no wheezing, rales,rhonchi or crepitation. No use of accessory muscles of respiration.  CARDIOVASCULAR: S1, S2 normal. No murmurs, rubs, or gallops.  ABDOMEN: Soft, nontender, nondistended. Bowel sounds present. No organomegaly or mass.  EXTREMITIES: No pedal edema, cyanosis, or clubbing.  NEUROLOGIC: Cranial nerves II through XII are intact. Muscle strength 5/5 in all extremities. Sensation intact. Gait not checked.  PSYCHIATRIC: The patient is alert and oriented x 3.  SKIN: No rash, lesion, or  ulcer.   LABORATORY PANEL:   CBC  Recent Labs Lab 03/23/15 1103  WBC 11.4*  HGB 13.6  HCT 42.2  PLT 348   Chemistries   Recent Labs Lab 03/23/15 1103  NA 136  K 4.8  CL 102  CO2 23  GLUCOSE 163*  BUN 56*  CREATININE 1.52*  CALCIUM 9.1   Cardiac Enzymes  Recent Labs Lab 03/23/15 1103  TROPONINI <0.03    RADIOLOGY:  Dg Chest 2 View  03/23/2015   CLINICAL DATA:  Shortness of breath.  Lung cancer.  EXAM: CHEST  2 VIEW  COMPARISON:  CT scan dated 01/22/2015 and PET-CT dated 02/11/2015  FINDINGS: Power port in good position. The patient has developed extensive opacification of the right upper lobe with increased mediastinal and right hilar adenopathy. The tumor in the right upper lobe has increased in size. New right pleural effusion.  Heart size and pulmonary vascularity are normal and the left lung is clear. No acute osseous abnormality.  IMPRESSION: New volume loss and consolidation in the right upper lobe with increase on the  tumor in the mediastinum, right hilum, and right upper lobe.  New moderate right pleural effusion.   Electronically Signed   By: Lorriane Shire M.D.   On: 03/23/2015 12:38    EKG:   Normal sinus rhythm 88 bpm bifascicular block with left anterior fascicular block and right bundle branch block. Q waves septally.  IMPRESSION AND PLAN:   1. New right pleural effusion. Patient has a history of small cell lung cancer. This likely is the cause of the patient's shortness of breath. I will set up for an ultrasound-guided thoracentesis tomorrow. Hopefully this will help out with the patient's shortness of breath. I will get an oncology consultation by Dr. Oliva Bustard to talk about prognosis. Patient is a DO NOT RESUSCITATE. Overall prognosis is poor. ER physician started antibiotics and thinking there may be a pneumonia. I think this is less likely but I will continue antibiotics. Follow-up blood cultures. An culture from thoracentesis. 2. Essential hypertension-  hold lisinopril had core thiazide secondary to dehydration. Continue other medications. 3. Dehydration and acute kidney injury- I will give gentle IV fluid hydration. 4. Diabetes type 2 controlled. I will put on sliding scale while in the hospital. 5. Glaucoma- continue eyedrops. 6. Hyperlipidemia unspecified continue atorvastatin.  All the records are reviewed and case discussed with ED provider. Management plans discussed with the patient, family and they are in agreement.  CODE STATUS: DO NOT RESUSCITATE  TOTAL TIME TAKING CARE OF THIS PATIENT: 55 minutes.    Loletha Grayer M.D on 03/23/2015 at 2:44 PM  Between 7am to 6pm - Pager - [phone]  After 6pm call admission pager [facility]  Office  [phone]  CC: Primary care physician; Adin Hector, MD

## 2015-03-23 NOTE — ED Provider Notes (Signed)
[facility] Emergency Department Provider Note   ____________________________________________  Time seen: On arrival to room I have reviewed the triage vital signs and the triage nursing note.  HISTORY  Chief Complaint Respiratory Distress   Historian Patient, spouse, brother, and nephew  HPI William Mullen is a 81 y.o. male was recently diagnosed with lung cancer and is supposed to start chemotherapy tomorrow, however for the past several weeks has been increasingly short of breath with a dry cough, generalized weakness, and decreased by mouth intake due to no appetite. Family brought him in for evaluation today to see her concerned about the cough and shortness of breath whether or not he could have a pneumonia. There are also very concerned about his poor by mouth intake. Symptoms are considered mild to moderate. He does not normally wear oxygen. He is not hypoxic here. There's been no chest pain. He has lost 10 or so pounds over the past couple weeks per the patient.   Past Medical History  Diagnosis Date  . Cancer of right lung   . Carcinoma of right lung 02/19/2015  . Prostate ca   . Hypertension   . Diabetes mellitus without complication     Metformin  . Renal insufficiency   . Pneumonia   . Arthritis   . Lung cancer   . Gastrointestinal bleeding ulcer     Patient Active Problem List   Diagnosis Date Noted  . DU (duodenal ulcer) 02/19/2015  . Glaucoma 02/19/2015  . H/O gastrointestinal hemorrhage 02/19/2015  . Hemorrhoid 02/19/2015  . H/O malignant neoplasm 02/19/2015  . HLD (hyperlipidemia) 02/19/2015  . Arthritis, degenerative 02/19/2015  . Carcinoma of right lung 02/19/2015  . Diabetes mellitus, type 2 07/17/2014  . Hydronephrosis 07/19/2013    Past Surgical History  Procedure Laterality Date  . Nose surgery      broken nose  . Urostomy for bladder cancer    . Toe surgery      broken toe  . Pancreatic cyst removal    .  Gastrointestinal clips for bleeding ulcer    . Peripheral vascular catheterization N/A 03/14/2015    Procedure: Glori Luis Cath Insertion;  Surgeon: Katha Cabal, MD;  Location: [place] [facility];  Service: Cardiovascular;  Laterality: N/A;    Current Outpatient Rx  Name  Route  Sig  Dispense  Refill  . amLODipine (NORVASC) 5 MG tablet               . atorvastatin (LIPITOR) 20 MG tablet      20 mg. 0.5 tablet every evening         . bimatoprost (LUMIGAN) 0.03 % ophthalmic solution      Frequency:QHS   Dosage:0.0     Instructions:  Note:Dose: 0.03 %         . dorzolamide-timolol (COSOPT) 22.3-6.8 MG/ML ophthalmic solution               . glyBURIDE-metformin (GLUCOVANCE) 5-500 MG per tablet               . KLOR-CON M10 10 MEQ tablet                 Dispense as written.   Marland Kitchen levofloxacin (LEVAQUIN) 500 MG tablet   Oral   Take 500 mg by mouth daily.         Marland Kitchen lidocaine-prilocaine (EMLA) cream   Topical   Apply 1 application topically as needed.   30 g  3   . lisinopril-hydrochlorothiazide (PRINZIDE,ZESTORETIC) 20-12.5 MG per tablet      1 tablet 2 (two) times daily.          Marland Kitchen LUMIGAN 0.01 % SOLN                 Dispense as written.   . pantoprazole (PROTONIX) 40 MG tablet      40 mg 2 (two) times daily.            Allergies Amoxicillin-pot clavulanate and Ciprofloxacin  No family history on file.  Social History History  Substance Use Topics  . Smoking status: Former Smoker -- 2.00 packs/day    Quit date: 01/10/1981  . Smokeless tobacco: Not on file  . Alcohol Use: No    Review of Systems  Constitutional: Negative for fever. Eyes: Negative for visual changes. ENT: Negative for sore throat. Cardiovascular: Negative for chest pain. Respiratory: Positive for shortness of breath. No pleuritic chest pain Gastrointestinal: Some lower abdominal discomfort, mild. No vomiting and diarrhea. Genitourinary: Negative for  dysuria. Musculoskeletal: Negative for back pain. Skin: Negative for rash. Neurological: Negative for headaches, focal weakness or numbness. 10 point Review of Systems otherwise negative ____________________________________________   PHYSICAL EXAM:  VITAL SIGNS: ED Triage Vitals  Enc Vitals Group     BP 03/23/15 1046 107/64 mmHg     Pulse Rate 03/23/15 1046 91     Resp 03/23/15 1046 20     Temp 03/23/15 1046 97.4 F (36.3 C)     Temp Source 03/23/15 1046 Oral     SpO2 03/23/15 1046 96 %     Weight 03/23/15 1046 181 lb (82.101 kg)     Height 03/23/15 1046 '5\' 11"'$  (1.803 m)     Head Cir --      Peak Flow --      Pain Score 03/23/15 1047 0     Pain Loc --      Pain Edu? --      Excl. in [place]? --      Constitutional: Alert and oriented. Well appearing and in no distress. Eyes: Conjunctivae are normal. PERRL. Normal extraocular movements. ENT   Head: Normocephalic and atraumatic.   Nose: No congestion/rhinnorhea.   Mouth/Throat: Mucous membranes are mildly dry.   Neck: No stridor. Cardiovascular/Chest: Normal rate, regular rhythm.  No murmurs, rubs, or gallops. Respiratory: Normal respiratory effort without tachypnea nor retractions. Breath sounds are clear and equal bilaterally. No wheezes/rales/rhonchi. Gastrointestinal: Soft. No distention, no guarding, no rebound. Nontender  Genitourinary/rectal:Deferred Musculoskeletal: Nontender with normal range of motion in all extremities. No joint effusions.  No lower extremity tenderness nor edema. Neurologic:  Normal speech and language. No gross focal neurologic deficits are appreciated. Skin:  Skin is warm, dry and intact. No rash noted. Psychiatric: Mood and affect are normal. Speech and behavior are normal. Patient exhibits appropriate insight and judgment.  ____________________________________________   EKG I, Lisa Roca, MD, the attending physician have personally viewed and interpreted all ECGs.  No sinus  rhythm. 88 bpm. Right bundle branch block. Q waves septally. Nonspecific ST and T-wave. ____________________________________________  LABS (pertinent positives/negatives)  White blood count 11.4 BUN 56 and cranny 1.5-5 troponin less than 0.03  ____________________________________________  RADIOLOGY All Xrays were viewed by me. Imaging interpreted by Radiologist.  Chest 2 view: IMPRESSION: New volume loss and consolidation in the right upper lobe with increase on the tumor in the mediastinum, right hilum, and right upper lobe.  New moderate right pleural effusion. __________________________________________  PROCEDURES  Procedure(s) performed: None Critical Care performed: None  ____________________________________________   ED COURSE / ASSESSMENT AND PLAN  CONSULTATIONS: Face is face discussion with hospitalist in the emergency department  Pertinent labs & imaging results that were available during my care of the patient were reviewed by me and considered in my medical decision making (see chart for details).   Patient's symptoms sound as if he is having symptomatic shortness of breath due to known right upper lobe lung mass, as well as dehydration. Patient is to Around 30 when I'm in the room. He is not a fever, however with the worsening consolidation with also an elevated white blood cell count, this does raise concern for possible pneumonia, and I discussed with the family and initiated treatment with Rocephin and azithromycin.  Patient / Family / Caregiver informed of clinical course, medical decision-making process, and agree with plan.   I discussed return precautions, follow-up instructions, and discharged instructions with patient and/or family.  ___________________________________________   FINAL CLINICAL IMPRESSION(S) / ED DIAGNOSES   Final diagnoses:  Lung mass  Acute renal failure, unspecified acute renal failure type   right upper lobe pneumonia,  community acquired    Lisa Roca, MD 03/23/15 1408

## 2015-03-24 ENCOUNTER — Inpatient Hospital Stay: Payer: Medicare Other | Admitting: Oncology

## 2015-03-24 ENCOUNTER — Inpatient Hospital Stay: Payer: Medicare Other

## 2015-03-24 DIAGNOSIS — Z87891 Personal history of nicotine dependence: Secondary | ICD-10-CM

## 2015-03-24 DIAGNOSIS — J948 Other specified pleural conditions: Secondary | ICD-10-CM

## 2015-03-24 DIAGNOSIS — Z66 Do not resuscitate: Secondary | ICD-10-CM

## 2015-03-24 DIAGNOSIS — Z79899 Other long term (current) drug therapy: Secondary | ICD-10-CM

## 2015-03-24 DIAGNOSIS — R634 Abnormal weight loss: Secondary | ICD-10-CM

## 2015-03-24 DIAGNOSIS — N179 Acute kidney failure, unspecified: Secondary | ICD-10-CM

## 2015-03-24 DIAGNOSIS — J9600 Acute respiratory failure, unspecified whether with hypoxia or hypercapnia: Secondary | ICD-10-CM | POA: Diagnosis present

## 2015-03-24 DIAGNOSIS — E119 Type 2 diabetes mellitus without complications: Secondary | ICD-10-CM

## 2015-03-24 DIAGNOSIS — C3491 Malignant neoplasm of unspecified part of right bronchus or lung: Secondary | ICD-10-CM

## 2015-03-24 DIAGNOSIS — Z8711 Personal history of peptic ulcer disease: Secondary | ICD-10-CM

## 2015-03-24 DIAGNOSIS — I10 Essential (primary) hypertension: Secondary | ICD-10-CM

## 2015-03-24 DIAGNOSIS — Z515 Encounter for palliative care: Secondary | ICD-10-CM

## 2015-03-24 DIAGNOSIS — E43 Unspecified severe protein-calorie malnutrition: Secondary | ICD-10-CM | POA: Diagnosis present

## 2015-03-24 DIAGNOSIS — Z7982 Long term (current) use of aspirin: Secondary | ICD-10-CM

## 2015-03-24 DIAGNOSIS — C3490 Malignant neoplasm of unspecified part of unspecified bronchus or lung: Secondary | ICD-10-CM | POA: Diagnosis present

## 2015-03-24 DIAGNOSIS — J90 Pleural effusion, not elsewhere classified: Secondary | ICD-10-CM | POA: Diagnosis present

## 2015-03-24 DIAGNOSIS — Z8589 Personal history of malignant neoplasm of other organs and systems: Secondary | ICD-10-CM

## 2015-03-24 DIAGNOSIS — R630 Anorexia: Secondary | ICD-10-CM

## 2015-03-24 DIAGNOSIS — I739 Peripheral vascular disease, unspecified: Secondary | ICD-10-CM

## 2015-03-24 DIAGNOSIS — M129 Arthropathy, unspecified: Secondary | ICD-10-CM

## 2015-03-24 LAB — PROTEIN, BODY FLUID: Total protein, fluid: <3 g/dL

## 2015-03-24 LAB — CBC
HCT: 35.9 % — ABNORMAL LOW (ref 40.0–52.0)
Hemoglobin: 12 g/dL — ABNORMAL LOW (ref 13.0–18.0)
MCH: 28.2 pg (ref 26.0–34.0)
MCHC: 33.4 g/dL (ref 32.0–36.0)
MCV: 84.3 fL (ref 80.0–100.0)
PLATELETS: 263 10*3/uL (ref 150–440)
RBC: 4.26 MIL/uL — ABNORMAL LOW (ref 4.40–5.90)
RDW: 15 % — AB (ref 11.5–14.5)
WBC: 10.1 10*3/uL (ref 3.8–10.6)

## 2015-03-24 LAB — GLUCOSE, CAPILLARY
GLUCOSE-CAPILLARY: 172 mg/dL — AB (ref 65–99)
Glucose-Capillary: 93 mg/dL (ref 65–99)
Glucose-Capillary: 146 mg/dL — ABNORMAL HIGH (ref 65–99)
Glucose-Capillary: 139 mg/dL — ABNORMAL HIGH (ref 65–99)
Glucose-Capillary: 185 mg/dL — ABNORMAL HIGH (ref 65–99)
Glucose-Capillary: 182 mg/dL — ABNORMAL HIGH (ref 65–99)

## 2015-03-24 LAB — BASIC METABOLIC PANEL
Anion gap: 5 (ref 5–15)
BUN: 50 mg/dL — AB (ref 6–20)
CHLORIDE: 107 mmol/L (ref 101–111)
CO2: 24 mmol/L (ref 22–32)
Calcium: 8.2 mg/dL — ABNORMAL LOW (ref 8.9–10.3)
Creatinine, Ser: 1.27 mg/dL — ABNORMAL HIGH (ref 0.61–1.24)
GFR calc Af Amer: 56 mL/min — ABNORMAL LOW (ref >60)
GFR, EST NON AFRICAN AMERICAN: 48 mL/min — AB (ref >60)
GLUCOSE: 132 mg/dL — AB (ref 65–99)
POTASSIUM: 4.5 mmol/L (ref 3.5–5.1)
Sodium: 136 mmol/L (ref 135–145)

## 2015-03-24 LAB — GLUCOSE, SEROUS FLUID: Glucose, Fluid: 118 mg/dL

## 2015-03-24 LAB — BODY FLUID CELL COUNT WITH DIFFERENTIAL
Lymphs, Fluid: 6 %
Monocyte-Macrophage-Serous Fluid: 87 %
Neutrophil Count, Fluid: 7 %
Total Nucleated Cell Count, Fluid: 1630 cu mm

## 2015-03-24 LAB — LACTATE DEHYDROGENASE, PLEURAL OR PERITONEAL FLUID: LD, Fluid: 1311 U/L — ABNORMAL HIGH (ref 3–23)

## 2015-03-24 MED ORDER — PALONOSETRON HCL INJECTION 0.25 MG/5ML
0.2500 mg | Freq: Once | INTRAVENOUS | Status: DC
  Filled 2015-03-24: qty 5

## 2015-03-24 MED ORDER — HEPARIN SOD (PORK) LOCK FLUSH 100 UNIT/ML IV SOLN: 500.0000 [IU] | INTRAVENOUS | Status: DC | PRN

## 2015-03-24 MED ORDER — SODIUM CHLORIDE 0.9 % IJ SOLN: 10.0000 mL | INTRAMUSCULAR | Status: DC | PRN

## 2015-03-24 MED ORDER — HYDROCODONE-ACETAMINOPHEN 5-325 MG PO TABS
1.0000 | ORAL_TABLET | Freq: Four times a day (QID) | ORAL | Status: DC | PRN
  Administered 2015-03-24: 1 via ORAL
  Filled 2015-03-24: qty 1

## 2015-03-24 MED ORDER — ENSURE ENLIVE PO LIQD
237.0000 mL | Freq: Three times a day (TID) | ORAL | Status: DC
  Administered 2015-03-24 – 2015-03-26 (×5): 237 mL via ORAL

## 2015-03-24 MED ORDER — SODIUM CHLORIDE 0.9 % IV SOLN: 310.0000 mg | Freq: Once | INTRAVENOUS | Status: DC

## 2015-03-24 MED ORDER — SODIUM CHLORIDE 0.9 % IV SOLN: 80.0000 mg/m2 | Freq: Once | INTRAVENOUS | Status: DC

## 2015-03-24 MED ORDER — SODIUM CHLORIDE 0.9 % IV SOLN
Freq: Once | INTRAVENOUS | Status: DC
  Filled 2015-03-24: qty 4

## 2015-03-24 MED ORDER — FOSAPREPITANT DIMEGLUMINE INJECTION 150 MG: Freq: Once | INTRAVENOUS | Status: DC

## 2015-03-24 MED ORDER — SODIUM CHLORIDE 0.9 % IV SOLN: Freq: Once | INTRAVENOUS | Status: DC

## 2015-03-24 MED ORDER — PEGFILGRASTIM INJECTION 6 MG/0.6ML ~~LOC~~: 6.0000 mg | PREFILLED_SYRINGE | Freq: Once | SUBCUTANEOUS | Status: DC

## 2015-03-24 MED ORDER — MORPHINE SULFATE 2 MG/ML IJ SOLN
1.0000 mg | Freq: Once | INTRAMUSCULAR | Status: AC
  Administered 2015-03-24: 11:00:00 1 mg via INTRAVENOUS
  Filled 2015-03-24: qty 1

## 2015-03-24 NOTE — Progress Notes (Signed)
Patient ID: William Mullen, male   DOB: [birth year], 81 y.o.   MRN: [id number] SUBJECTIVE:  81 yo male with small lung cancer, with deterioration over last 1 month, including more SOB. Underwent repeat CT 1 wk ago which reveals rapid progression.  S/p port placement, but hasn't yet started treatment. Films now with pleural effusion, likely malignant, and pt with worsening symptoms  ______________________________________________________________________  ROS: Please see HPI; remainder of complete 10 point ROS is negative   Past Medical History  Diagnosis Date  . Cancer of right lung   . Carcinoma of right lung 02/19/2015  . Prostate ca   . Hypertension   . Diabetes mellitus without complication     Metformin  . Renal insufficiency   . Pneumonia   . Arthritis   . Lung cancer   . Gastrointestinal bleeding ulcer     Past Surgical History  Procedure Laterality Date  . Nose surgery      broken nose  . Urostomy for bladder cancer    . Toe surgery      broken toe  . Pancreatic cyst removal    . Gastrointestinal clips for bleeding ulcer    . Peripheral vascular catheterization N/A 03/14/2015    Procedure: Glori Luis Cath Insertion;  Surgeon: Katha Cabal, MD;  Location: [place] [facility];  Service: Cardiovascular;  Laterality: N/A;     Current facility-administered medications:  .  0.9 %  sodium chloride infusion, , Intravenous, Continuous, Loletha Grayer, MD, Last Rate: 50 mL/hr at 03/24/15 0701 .  acetaminophen (TYLENOL) tablet 650 mg, 650 mg, Oral, Q6H PRN, 650 mg at 03/23/15 1552 **OR** acetaminophen (TYLENOL) suppository 650 mg, 650 mg, Rectal, Q6H PRN, Loletha Grayer, MD .  amLODipine (NORVASC) tablet 5 mg, 5 mg, Oral, Daily, Loletha Grayer, MD .  antiseptic oral rinse (CPC / CETYLPYRIDINIUM CHLORIDE 0.05%) solution 7 mL, 7 mL, Mouth Rinse, q12n4p, Tama High III, MD, 7 mL at 03/23/15 1700 .  atorvastatin (LIPITOR) tablet 10 mg, 10 mg, Oral, q1800, Loletha Grayer,  MD, 10 mg at 03/23/15 2151 .  azithromycin (ZITHROMAX) tablet 250 mg, 250 mg, Oral, Daily, Loletha Grayer, MD .  cefTRIAXone (ROCEPHIN) 1 g in dextrose 5 % 50 mL IVPB - Premix, 1 g, Intravenous, Q24H, Loletha Grayer, MD .  chlorhexidine (PERIDEX) 0.12 % solution 15 mL, 15 mL, Mouth Rinse, BID, Tama High III, MD, 15 mL at 03/23/15 2001 .  dorzolamide-timolol (COSOPT) 22.3-6.8 MG/ML ophthalmic solution 1 drop, 1 drop, Left Eye, BID, Loletha Grayer, MD, 1 drop at 03/23/15 1957 .  feeding supplement (ENSURE ENLIVE) (ENSURE ENLIVE) liquid 237 mL, 237 mL, Oral, BID BM, Tama High III, MD .  insulin aspart (novoLOG) injection 0-5 Units, 0-5 Units, Subcutaneous, QHS, Loletha Grayer, MD, 0 Units at 03/23/15 2148 .  insulin aspart (novoLOG) injection 0-9 Units, 0-9 Units, Subcutaneous, TID WC, Loletha Grayer, MD, 0 Units at 03/23/15 1716 .  ipratropium-albuterol (DUONEB) 0.5-2.5 (3) MG/3ML nebulizer solution 3 mL, 3 mL, Nebulization, Q6H, Loletha Grayer, MD, 3 mL at 03/24/15 0221 .  latanoprost (XALATAN) 0.005 % ophthalmic solution 1 drop, 1 drop, Left Eye, QHS, Loletha Grayer, MD, 1 drop at 03/23/15 1958 .  oxyCODONE (Oxy IR/ROXICODONE) immediate release tablet 5 mg, 5 mg, Oral, Q4H PRN, Loletha Grayer, MD, 5 mg at 03/24/15 0647 .  pantoprazole (PROTONIX) EC tablet 40 mg, 40 mg, Oral, BID AC, Loletha Grayer, MD, 40 mg at 03/23/15 1724 .  polyethylene glycol (MIRALAX / GLYCOLAX) packet 17  g, 17 g, Oral, Daily PRN, Loletha Grayer, MD  PHYSICAL EXAM:  BP 111/51 mmHg  Pulse 86  Temp(Src) 97.9 F (36.6 C) (Oral)  Resp 18  Ht '5\' 11"'$  (1.803 m)  Wt 82.101 kg (181 lb)  BMI 25.26 kg/m2  SpO2 94%  General: elderly  male, appears SOB HEENT: PERRL; OP dry without lesions. Neck: supple, trachea midline, no thyromegaly Chest: normal to palpation Lungs: poor airflow throughout right lung field; occ wheeze; no retractions Cardiovascular: RRR, no gallop; distal pulses 2+ Abdomen: soft, nontender,  nondistended, positive bowel sounds Extremities: no clubbing, cyanosis, edema Neuro: alert and oriented, moves all extremities Derm: no significant rashes or nodules; decreased skin turgor Lymph: no cervical or supraclavicular lymphadenopathy  Labs and imaging studies were reviewed  ASSESSMENT/PLAN:   1. Acute resp failure/pleural effusion, likely malignant/small cell lung cancer- oncology to see, as suspect this is further progression of malignancy.  Has evidence of SVC impingement on last CT as well.  On empiric abx, will continue this for now 2. Protein/calorie malnutrition- dietary to see; consider adding steroids for breathing, appetite, but would have to watch glucose 3. DM- cover with SSI 4. CKD stage 3- gentle hydration; following 5. HTN- hold meds and follow

## 2015-03-24 NOTE — Plan of Care (Signed)
Problem: Discharge Progression Outcomes Goal: Other Discharge Outcomes/Goals 1. No c/o pain. N/V x1 relieved by cool wash cloth & diet ginger ale. Pt refused medication.  2. Hemodynamically:              -VSS, afebrile, remains on 1L [place] w/ stable sats             -IVF infusing, IV Abx given as scheduled              -US Thoracentesis scheduled for 03/24/15 for right pleural effusion 3. Activity: out of bed w/ standby assist.  4. Recently diagnosed with lung cancer, patient is followed by Dr. Jeb Levering, oncology consulted. Pt originially to start chemo 03/24/15

## 2015-03-24 NOTE — Progress Notes (Signed)
[facility] [phone]  Fax:([phone]) Gregory OB: 09-Jun-1923  MR#: [id number]  MPN#:[id number]  Patient Care Team: Adin Hector, MD as PCP - General (Internal Medicine)  CHIEF COMPLAINT:  Chief Complaint  Patient presents with  . Respiratory Distress    VISIT DIAGNOSIS:     ICD-9-CM ICD-10-CM   1. Lung mass 786.6 R91.8   2. Acute renal failure, unspecified acute renal failure type 584.9 N17.9   3. Pleural effusion on right 511.9 J94.8 US THORACENTESIS ASP PLEURAL SPACE W/IMG GUIDE     US THORACENTESIS ASP PLEURAL SPACE W/IMG GUIDE     DG Chest 1 View     DG Chest 1 View  4. Status post thoracentesis V45.89 Z98.89 CANCELED: DG Chest Portable 2 Views (NEONATE)     CANCELED: DG Chest Portable 2 Views (79)  5. Small cell lung cancer, right 162.9 C34.91 etoposide (VEPESID) 160 mg in sodium chloride 0.9 % 500 mL chemo infusion     etoposide (VEPESID) 160 mg in sodium chloride 0.9 % 500 mL chemo infusion     etoposide (VEPESID) 160 mg in sodium chloride 0.9 % 500 mL chemo infusion     palonosetron (ALOXI) injection 0.25 mg     fosaprepitant (EMEND) 150 mg, dexamethasone (DECADRON) 12 mg in sodium chloride 0.9 % 145 mL IVPB     ondansetron (ZOFRAN) 8 mg, dexamethasone (DECADRON) 8 mg in sodium chloride 0.9 % 50 mL IVPB     ondansetron (ZOFRAN) 8 mg, dexamethasone (DECADRON) 8 mg in sodium chloride 0.9 % 50 mL IVPB     CARBOplatin (PARAPLATIN) 310 mg in sodium chloride 0.9 % 100 mL chemo infusion     Oncology History   1.  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  Patient underwent thoracentesis following that significant improvement in shortness of breath.  Since condition has declined   Over period of lasr few weeks  few weeks.  Repeat CT scan shows progressive disease. Poor appetite and has lost weight.  No chills.  No fever. REVIEW OF SYSTEMS:   Gen. status: Alert oriented individual not in any acute distress.  Formal status is 2 HENT: No headache.  No difficulty in swallowing.  No neck masses. Lungs: Shortness of breath on exertion.  Dry hacking cough.  No hemoptysis.Had the new pleural effusion and underwent thoracentesis  GI: no constipation.  No rectal bleeding.  No abdominal pain. Lower extremityswelling.  Skin: No rash. Neurological system: No headache no dizziness. GU: Patient has some cystectomy and ostomy.  Has recurrent DVT tract infection. Psychiatric system: No major symptom Patient lives in assisted living facility His wife also has a lung cancer which is recurrent and under hospice care   As per HPI. Otherwise, a complete review of systems is negatve.  PAST MEDICAL HISTORY: Past Medical History  Diagnosis Date  . Cancer of right lung   . Carcinoma of right lung 02/19/2015  . Prostate ca   . Hypertension   . Diabetes mellitus without complication     Metformin  .  Renal insufficiency   . Pneumonia   . Arthritis   . Lung cancer   . Gastrointestinal bleeding ulcer   Patient had a sarcoma off prostate status post cystoprostatectomy 8 history of cataracts.  Glaucoma.  Patient is hard of hearing. Review of diabetes.  PAST SURGICAL HISTORY: As mentioned aboVE FAMILY HISTORY NO FAMILY HISTORY OF BREAST CANCER, COLON CANCER, OVARIAN CANCER      ADVANCED DIRECTIVES: This and does have a living will.   does have advanced healthcare directive  HEALTH MAINTENANCE: History  Substance Use Topics  . Smoking status: Former Smoker -- 2.00 packs/day    Quit  date: 01/10/1981  . Smokeless tobacco: Not on file  . Alcohol Use: No      Allergies  Allergen Reactions  . Amoxicillin-Pot Clavulanate Nausea Only  . Ciprofloxacin Nausea Only    Current Facility-Administered Medications  Medication Dose Route Frequency Provider Last Rate Last Dose  . 0.9 %  sodium chloride infusion   Intravenous Continuous Loletha Grayer, MD 50 mL/hr at 03/24/15 1445    . acetaminophen (TYLENOL) tablet 650 mg  650 mg Oral Q6H PRN Loletha Grayer, MD   650 mg at 03/24/15 1246   Or  . acetaminophen (TYLENOL) suppository 650 mg  650 mg Rectal Q6H PRN Loletha Grayer, MD      . antiseptic oral rinse (CPC / CETYLPYRIDINIUM CHLORIDE 0.05%) solution 7 mL  7 mL Mouth Rinse q12n4p Tama High III, MD   7 mL at 03/23/15 1700  . [START ON 03/25/2015] CARBOplatin (PARAPLATIN) 310 mg in sodium chloride 0.9 % 100 mL chemo infusion  310 mg Intravenous Once Forest Gleason, MD      . cefTRIAXone (ROCEPHIN) 1 g in dextrose 5 % 50 mL IVPB - Premix  1 g Intravenous Q24H Loletha Grayer, MD   1 g at 03/24/15 1247  . chlorhexidine (PERIDEX) 0.12 % solution 15 mL  15 mL Mouth Rinse BID Tama High III, MD   15 mL at 03/23/15 2001  . dorzolamide-timolol (COSOPT) 22.3-6.8 MG/ML ophthalmic solution 1 drop  1 drop Left Eye BID Loletha Grayer, MD   1 drop at 03/24/15 0831  . [START ON 03/25/2015] etoposide (VEPESID) 160 mg in sodium chloride 0.9 % 500 mL chemo infusion  80 mg/m2 Intravenous Once Forest Gleason, MD      . Derrill Memo ON 03/26/2015] etoposide (VEPESID) 160 mg in sodium chloride 0.9 % 500 mL chemo infusion  80 mg/m2 Intravenous Once Forest Gleason, MD       Followed by  . [START ON 03/27/2015] etoposide (VEPESID) 160 mg in sodium chloride 0.9 % 500 mL chemo infusion  80 mg/m2 Intravenous Once Forest Gleason, MD      . feeding supplement (ENSURE ENLIVE) (ENSURE ENLIVE) liquid 237 mL  237 mL Oral TID WC Adin Hector, MD      . Derrill Memo ON 03/25/2015] fosaprepitant (EMEND) 150 mg, dexamethasone  (DECADRON) 12 mg in sodium chloride 0.9 % 145 mL IVPB   Intravenous Once Forest Gleason, MD      . insulin aspart (novoLOG) injection 0-5 Units  0-5 Units Subcutaneous QHS Loletha Grayer, MD   0 Units at 03/23/15 2148  . insulin aspart (novoLOG) injection 0-9 Units  0-9 Units Subcutaneous TID WC Loletha Grayer, MD   0 Units at 03/23/15 1716  . ipratropium-albuterol (DUONEB) 0.5-2.5 (3) MG/3ML nebulizer solution 3 mL  3 mL Nebulization Q6H Loletha Grayer, MD   3 mL at 03/24/15 1401  . latanoprost (  XALATAN) 0.005 % ophthalmic solution 1 drop  1 drop Left Eye QHS Loletha Grayer, MD   1 drop at 03/23/15 1958  . [START ON 03/26/2015] ondansetron (ZOFRAN) 8 mg, dexamethasone (DECADRON) 8 mg in sodium chloride 0.9 % 50 mL IVPB   Intravenous Once Forest Gleason, MD       Followed by  . [START ON 03/27/2015] ondansetron (ZOFRAN) 8 mg, dexamethasone (DECADRON) 8 mg in sodium chloride 0.9 % 50 mL IVPB   Intravenous Once Forest Gleason, MD      . oxyCODONE (Oxy IR/ROXICODONE) immediate release tablet 5 mg  5 mg Oral Q4H PRN Loletha Grayer, MD   5 mg at 03/24/15 0647  . [START ON 03/25/2015] palonosetron (ALOXI) injection 0.25 mg  0.25 mg Intravenous Once Forest Gleason, MD      . pantoprazole (PROTONIX) EC tablet 40 mg  40 mg Oral BID AC Loletha Grayer, MD   40 mg at 03/24/15 0830  . polyethylene glycol (MIRALAX / GLYCOLAX) packet 17 g  17 g Oral Daily PRN Loletha Grayer, MD        OBJECTIVE: PHYSICAL EXAM: GENERAL:  Well developed, well nourished, sitting comfortably in the exam room in no acute distress. MENTAL STATUS:  Alert and oriented to person, place and time. HEAD: alopecia.  Normocephalic, atraumatic, face symmetric, no Cushingoid features. EYES:  .  Pupils equal round and reactive to light and accomodation.  No conjunctivitis or scleral icterus. ENT:  Oropharynx clear without lesion.  Tongue normal. Mucous membranes moist.  RESPIRATORY:  Emphysematous chest.  Diminished air entry on both sides.   Occasional rhonchi on the right side CARDIOVASCULAR:  Regular rate and rhythm without murmur, rub or gallop. BREAST:  Right breast without masses, skin changes or nipple discharge.  Left breast without masses, skin changes or nipple discharge. ABDOMEN:  Soft, non-tender, with active bowel sounds, and no hepatosplenomegaly.  No masses. BACK:  No CVA tenderness.  No tenderness on percussion of the back or rib cage. SKIN:  No rashes, ulcers or lesions. EXTREMITIES: No edema, no skin discoloration or tenderness.  No palpable cords. LYMPH NODES: No palpable cervical, supraclavicular, axillary or inguinal adenopathy  NEUROLOGICAL: Unremarkable. PSYCH:  Appropriate.  Filed Vitals:   03/24/15 1236  BP: 119/54  Pulse: 77  Temp: 97.6 F (36.4 C)  Resp: 20     Body mass index is 24.49 kg/(m^2).    ECOG FS:1 - Symptomatic but completely ambulatory  LAB RESULTS:  Admission on 03/23/2015  Component Date Value Ref Range Status  . WBC 03/23/2015 11.4* 3.8 - 10.6 K/uL Final  . RBC 03/23/2015 4.99  4.40 - 5.90 MIL/uL Final  . Hemoglobin 03/23/2015 13.6  13.0 - 18.0 g/dL Final  . HCT 03/23/2015 42.2  40.0 - 52.0 % Final  . MCV 03/23/2015 84.7  80.0 - 100.0 fL Final  . MCH 03/23/2015 27.4  26.0 - 34.0 pg Final  . MCHC 03/23/2015 32.3  32.0 - 36.0 g/dL Final  . RDW 03/23/2015 15.1* 11.5 - 14.5 % Final  . Platelets 03/23/2015 348  150 - 440 K/uL Final  . Sodium 03/23/2015 136  135 - 145 mmol/L Final  . Potassium 03/23/2015 4.8  3.5 - 5.1 mmol/L Final  . Chloride 03/23/2015 102  101 - 111 mmol/L Final  . CO2 03/23/2015 23  22 - 32 mmol/L Final  . Glucose, Bld 03/23/2015 163* 65 - 99 mg/dL Final  . BUN 03/23/2015 56* 6 - 20 mg/dL Final  . Creatinine, Ser 03/23/2015  1.52* 0.61 - 1.24 mg/dL Final  . Calcium 03/23/2015 9.1  8.9 - 10.3 mg/dL Final  . GFR calc non Af Amer 03/23/2015 39* >60 mL/min Final  . GFR calc Af Amer 03/23/2015 45* >60 mL/min Final   Comment: (NOTE) The eGFR has been calculated  using the CKD EPI equation. This calculation has not been validated in all clinical situations. eGFR's persistently <60 mL/min signify possible Chronic Kidney Disease.   . Anion gap 03/23/2015 11  5 - 15 Final  . Troponin I 03/23/2015 <0.03  <0.031 ng/mL Final   Comment:        NO INDICATION OF MYOCARDIAL INJURY.   Marland Kitchen Specimen Description 03/23/2015 BLOOD   Final  . Special Requests 03/23/2015 NONE   Final  . Culture 03/23/2015 NO GROWTH < 24 HOURS   Final  . Report Status 03/23/2015 PENDING   Incomplete  . Specimen Description 03/23/2015 BLOOD   Final  . Special Requests 03/23/2015 NONE   Final  . Culture 03/23/2015 NO GROWTH < 24 HOURS   Final  . Report Status 03/23/2015 PENDING   Incomplete  . Sodium 03/24/2015 136  135 - 145 mmol/L Final  . Potassium 03/24/2015 4.5  3.5 - 5.1 mmol/L Final  . Chloride 03/24/2015 107  101 - 111 mmol/L Final  . CO2 03/24/2015 24  22 - 32 mmol/L Final  . Glucose, Bld 03/24/2015 132* 65 - 99 mg/dL Final  . BUN 03/24/2015 50* 6 - 20 mg/dL Final  . Creatinine, Ser 03/24/2015 1.27* 0.61 - 1.24 mg/dL Final  . Calcium 03/24/2015 8.2* 8.9 - 10.3 mg/dL Final  . GFR calc non Af Amer 03/24/2015 48* >60 mL/min Final  . GFR calc Af Amer 03/24/2015 56* >60 mL/min Final   Comment: (NOTE) The eGFR has been calculated using the CKD EPI equation. This calculation has not been validated in all clinical situations. eGFR's persistently <60 mL/min signify possible Chronic Kidney Disease.   . Anion gap 03/24/2015 5  5 - 15 Final  . WBC 03/24/2015 10.1  3.8 - 10.6 K/uL Final  . RBC 03/24/2015 4.26* 4.40 - 5.90 MIL/uL Final  . Hemoglobin 03/24/2015 12.0* 13.0 - 18.0 g/dL Final  . HCT 03/24/2015 35.9* 40.0 - 52.0 % Final  . MCV 03/24/2015 84.3  80.0 - 100.0 fL Final  . MCH 03/24/2015 28.2  26.0 - 34.0 pg Final  . MCHC 03/24/2015 33.4  32.0 - 36.0 g/dL Final  . RDW 03/24/2015 15.0* 11.5 - 14.5 % Final  . Platelets 03/24/2015 263  150 - 440 K/uL Final  . MRSA by PCR  03/23/2015 NEGATIVE  NEGATIVE Final   Comment:        The GeneXpert MRSA Assay (FDA approved for NASAL specimens only), is one component of a comprehensive MRSA colonization surveillance program. It is not intended to diagnose MRSA infection nor to guide or monitor treatment for MRSA infections.   . Glucose-Capillary 03/23/2015 101* 65 - 99 mg/dL Final  . Comment 1 03/23/2015 Notify RN   Final  . Glucose-Capillary 03/23/2015 149* 65 - 99 mg/dL Final  . Glucose-Capillary 03/24/2015 93  65 - 99 mg/dL Final  . Glucose-Capillary 03/24/2015 146* 65 - 99 mg/dL Final  . LD, Fluid 03/24/2015 1311* 3 - 23 U/L Final   Comment: (NOTE) Results should be evaluated in conjunction with serum values   . Fluid Type-FLDH 03/24/2015 CYTO PLEU   Final  . Total protein, fluid 03/24/2015 <3.0   Final   Comment: (NOTE) No normal  range established for this test Results should be evaluated in conjunction with serum values   . Fluid Type-FTP 03/24/2015 CYTO PLEU   Final  . Fluid Type-FCT 03/24/2015 CYTO PLEU   Final  . Color, Fluid 03/24/2015 YELLOW  YELLOW Final  . Appearance, Fluid 03/24/2015 CLOUDY* CLEAR Final  . WBC, Fluid 03/24/2015 1630   Final  . Neutrophil Count, Fluid 03/24/2015 7   Final  . Lymphs, Fluid 03/24/2015 6   Final  . Monocyte-Macrophage-Serous Fluid 03/24/2015 87   Final  . Glucose, Fluid 03/24/2015 118   Final   Comment: (NOTE) No normal range established for this test Results should be evaluated in conjunction with serum values   . Fluid Type-FGLU 03/24/2015 CYTO PLEU   Final  . Glucose-Capillary 03/24/2015 139* 65 - 99 mg/dL Final  . Glucose-Capillary 03/24/2015 185* 65 - 99 mg/dL Final   Outside lab dated January 16, 2015 Glucose 127. Sodium 139  Potassium 4.3 creatinine 1.1  Liver enzymes are normal   STUDIES: Dg Chest 1 View  03/24/2015   CLINICAL DATA:  Postop right thoracentesis  EXAM: CHEST  1 VIEW  COMPARISON:  Chest radiograph dated 03/23/2015  FINDINGS: Right  upper lobe/mediastinal mass.  No pneumothorax is seen.  Left lung is clear.  Right chest power port terminates at the cavoatrial junction.  IMPRESSION: Right upper lobe/mediastinal mass.  No pneumothorax is seen.   Electronically Signed   By: Julian Hy M.D.   On: 03/24/2015 13:03   Dg Chest 2 View  03/23/2015   CLINICAL DATA:  Shortness of breath.  Lung cancer.  EXAM: CHEST  2 VIEW  COMPARISON:  CT scan dated 01/22/2015 and PET-CT dated 02/11/2015  FINDINGS: Power port in good position. The patient has developed extensive opacification of the right upper lobe with increased mediastinal and right hilar adenopathy. The tumor in the right upper lobe has increased in size. New right pleural effusion.  Heart size and pulmonary vascularity are normal and the left lung is clear. No acute osseous abnormality.  IMPRESSION: New volume loss and consolidation in the right upper lobe with increase on the tumor in the mediastinum, right hilum, and right upper lobe.  New moderate right pleural effusion.   Electronically Signed   By: Lorriane Shire M.D.   On: 03/23/2015 12:38   Ct Head W Wo Contrast  03/11/2015   CLINICAL DATA:  81 year old male with right side headache and ataxia. Neck pain. Small cell lung cancer. Subsequent encounter.  EXAM: CT HEAD WITHOUT AND WITH CONTRAST  CT NECK WITH CONTRAST  TECHNIQUE: Contiguous axial images were obtained from the base of the skull through the vertex without and with intravenous contrast  Multidetector CT imaging of the and neck was performed using the standard protocol following the bolus administration of intravenous contrast.  CONTRAST:  28m OMNIPAQUE IOHEXOL 300 MG/ML  SOLN  COMPARISON:  PET-CT 02/11/2015  FINDINGS: CT HEAD FINDINGS  No orbit or scalp soft tissue abnormality. Visualized paranasal sinuses and mastoids are clear. Calvarium within normal limits.  Mild Calcified atherosclerosis at the skull base. Cerebral volume is within normal limits for age. Mild for  age nonspecific periventricular white matter hypodensity. No midline shift, ventriculomegaly, mass effect, evidence of mass lesion, intracranial hemorrhage or evidence of cortically based acute infarction. Gray-white matter differentiation is within normal limits throughout the brain. No abnormal enhancement identified. Major intracranial vascular structures are enhancing. Dominant left vertebral artery.  CT NECK FINDINGS  Progressed septal thickening and widespread right  perihilar nodular, irregular, and confluent pulmonary opacity since 02/11/2015 (series 5, image 50 today versus series 3, image 85 previously). Bulky associated mediastinal and right peritracheal metastatic nodal disease with extracapsular extension also has significantly progressed. On series 3, image 113 today conglomerate aunt ex nodal disease encompasses 76 x 58 mm (AP x lateral, versus individual nodes up to 16 mm on series 3, image 73 of the comparison). New small layering right pleural effusion is visible.  Right peritracheal nodal disease extends to the thoracic inlet (16 mm short axis node on series 3, image 91) and right level 4 lymphadenopathy with extracapsular extension has progressed (now conglomerate disease measuring 28 x 21 mm to the right of the thyroid lobe versus only subcentimeter right level 4 nodes previously). The right thyroid parenchyma remain spared.  Evidence of right vocal cord paralysis. Otherwise negative larynx. Pharynx soft tissue contours are within normal limits. Negative parapharyngeal spaces, retropharyngeal space, sublingual space, submandibular glands and parotid glands.  Major vascular structures in the neck and at the skullbase are patent. In the superior mediastinum there is near complete effacement of the superior vena cava (series 3, image 113) which appears to remain patent currently.  No level 3 or level 2 lymphadenopathy. No level 1 or level 5 lymphadenopathy in the neck.  Degenerative changes in the  visible spine. No definite acute or metastatic osseous lesion in the neck; osteopenia of the posterior C7 vertebral body appears stable to the comparison.  IMPRESSION: CT HEAD:  No acute or metastatic intracranial abnormality.  CT NECK:  1. Rapid progression since the recent PET-CT of right perihilar lung malignancy, mediastinal nodal disease with extracapsular extension, and right level 4 lower neck nodal disease with extracapsular extension. 2. Secondary findings including right vocal cord paralysis, and narrowing of the SVC in the superior mediastinum without thrombosis at this time.   Electronically Signed   By: Genevie Ann M.D.   On: 03/11/2015 11:04   Ct Soft Tissue Neck W Contrast  03/11/2015   CLINICAL DATA:  81 year old male with right side headache and ataxia. Neck pain. Small cell lung cancer. Subsequent encounter.  EXAM: CT HEAD WITHOUT AND WITH CONTRAST  CT NECK WITH CONTRAST  TECHNIQUE: Contiguous axial images were obtained from the base of the skull through the vertex without and with intravenous contrast  Multidetector CT imaging of the and neck was performed using the standard protocol following the bolus administration of intravenous contrast.  CONTRAST:  14m OMNIPAQUE IOHEXOL 300 MG/ML  SOLN  COMPARISON:  PET-CT 02/11/2015  FINDINGS: CT HEAD FINDINGS  No orbit or scalp soft tissue abnormality. Visualized paranasal sinuses and mastoids are clear. Calvarium within normal limits.  Mild Calcified atherosclerosis at the skull base. Cerebral volume is within normal limits for age. Mild for age nonspecific periventricular white matter hypodensity. No midline shift, ventriculomegaly, mass effect, evidence of mass lesion, intracranial hemorrhage or evidence of cortically based acute infarction. Gray-white matter differentiation is within normal limits throughout the brain. No abnormal enhancement identified. Major intracranial vascular structures are enhancing. Dominant left vertebral artery.  CT NECK  FINDINGS  Progressed septal thickening and widespread right perihilar nodular, irregular, and confluent pulmonary opacity since 02/11/2015 (series 5, image 50 today versus series 3, image 85 previously). Bulky associated mediastinal and right peritracheal metastatic nodal disease with extracapsular extension also has significantly progressed. On series 3, image 113 today conglomerate aunt ex nodal disease encompasses 76 x 58 mm (AP x lateral, versus individual nodes up to 16 mm  on series 3, image 73 of the comparison). New small layering right pleural effusion is visible.  Right peritracheal nodal disease extends to the thoracic inlet (16 mm short axis node on series 3, image 91) and right level 4 lymphadenopathy with extracapsular extension has progressed (now conglomerate disease measuring 28 x 21 mm to the right of the thyroid lobe versus only subcentimeter right level 4 nodes previously). The right thyroid parenchyma remain spared.  Evidence of right vocal cord paralysis. Otherwise negative larynx. Pharynx soft tissue contours are within normal limits. Negative parapharyngeal spaces, retropharyngeal space, sublingual space, submandibular glands and parotid glands.  Major vascular structures in the neck and at the skullbase are patent. In the superior mediastinum there is near complete effacement of the superior vena cava (series 3, image 113) which appears to remain patent currently.  No level 3 or level 2 lymphadenopathy. No level 1 or level 5 lymphadenopathy in the neck.  Degenerative changes in the visible spine. No definite acute or metastatic osseous lesion in the neck; osteopenia of the posterior C7 vertebral body appears stable to the comparison.  IMPRESSION: CT HEAD:  No acute or metastatic intracranial abnormality.  CT NECK:  1. Rapid progression since the recent PET-CT of right perihilar lung malignancy, mediastinal nodal disease with extracapsular extension, and right level 4 lower neck nodal disease  with extracapsular extension. 2. Secondary findings including right vocal cord paralysis, and narrowing of the SVC in the superior mediastinum without thrombosis at this time.   Electronically Signed   By: Genevie Ann M.D.   On: 03/11/2015 11:04   US Thoracentesis Asp Pleural Space W/img Guide  03/24/2015   CLINICAL DATA:  Large right pleural effusion  EXAM: ULTRASOUND GUIDED RIGHT THORACENTESIS  COMPARISON:  03/23/2015  PROCEDURE: An ultrasound guided thoracentesis was thoroughly discussed with the patient and questions answered. The benefits, risks, alternatives and complications were also discussed. The patient understands and wishes to proceed with the procedure. Written consent was obtained.  Ultrasound was performed to localize and mark an adequate pocket of fluid in the right chest. The area was then prepped and draped in the normal sterile fashion. 1% Lidocaine was used for local anesthesia. Under ultrasound guidance a safety centesis needle catheter was introduced. Thoracentesis was performed. The catheter was removed and a dressing applied.  Complications:  None immediate  FINDINGS: A total of approximately 1.8 L of serosanguineous pleural fluid was removed. A fluid sample wassent for laboratory analysis.  IMPRESSION: Successful ultrasound guided right thoracentesis yielding 1.8 L of pleural fluid.   Electronically Signed   By: Jerilynn Mages.  Shick M.D.   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  Discussed situation with Dr. Kerrin Mo and patient and family decided to proceed with chemotherapy. Carboplatinum with AUC of 50 day 1 and VP-16 80 mg intravenously daily 3 Patient may have to go to rehabilitation therapy after chemotherapy is finished Will proceed with Neulasta Overall prognosis is guarded.       Intent of chemotherapy is palliation and relief in  symptoms and extending survivalAll the side effects of chemotherapy including myelosuppression, alopecia, nausea vomiting fatigue weakness.  Secondary infection, and   peripheral neuropathy .  Has been discussed in details. Informal consent has been obtained and will be documented by nurses in the chart Patient will need port placement.  An chemotherapy class. Patient  and family something about all these options and will decide  Patient expressed understanding and was in agreement with this plan. He also understands that He can call clinic at any time with any questions, concerns, or complaints.   Total duration of visit was60 minutes.  50% or more time was spent in counseling patient and family regarding prognosis and options of treatment and available resources No matching staging information was found for the patient.  Forest Gleason, MD   03/24/2015 4:39 PM

## 2015-03-24 NOTE — Procedures (Signed)
Successful RT THORACENTESIS  1.8 L REMOVED No comp Stable Labs sent CXR pending

## 2015-03-24 NOTE — Progress Notes (Signed)
Initial Nutrition Assessment  DOCUMENTATION CODES:  Severe malnutrition in context of acute illness/injury however likely transitioning into chronic illness  INTERVENTION: Meals and Snacks: Cater to patient preferences Medical Food Supplement Therapy: agree with Ensure Enlive, will send TID with meals Ensure Enlive (each supplement provides 350kcal and 20 grams of protein)  NUTRITION DIAGNOSIS:  Inadequate oral intake related to acute illness, cancer and cancer related treatments as evidenced by per patient/family report.  GOAL:  Patient will meet greater than or equal to 90% of their needs  MONITOR:   (Energy Intake, Pulmonary Profile, Anthropometrics, Electrolyte and Renal Profile)  REASON FOR ASSESSMENT:  Consult Assessment of nutrition requirement/status  ASSESSMENT:  Pt admitted with acute respiratory failure and pleural effusion with small cell lung cancer. Pt s/p thoracentesis today. PMHx:  Past Medical History  Diagnosis Date  . Cancer of right lung   . Carcinoma of right lung 02/19/2015  . Prostate ca   . Hypertension   . Diabetes mellitus without complication     Metformin  . Renal insufficiency   . Pneumonia   . Arthritis   . Lung cancer   . Gastrointestinal bleeding ulcer    Diet Order:  Diet regular Room service appropriate?: Yes; Fluid consistency:: Thin  Current Nutrition: Pt drank a few sips of milk this am and that was all.   Food/Nutrition-Related History: Pt reports very poor appetite PTA eating roughly one half of a meal a day for the past 2-3 weeks. Pt reports early satiety and not being able to 'get anything down.' Pt reports drinking Ensure PTA but not consistently.   Medications: NS at 35m/hr, Novolog, Protonix  Electrolyte/Renal Profile and Glucose Profile:   Recent Labs Lab 03/23/15 1103 03/24/15 0504  NA 136 136  K 4.8 4.5  CL 102 107  CO2 23 24  BUN 56* 50*  CREATININE 1.52* 1.27*  CALCIUM 9.1 8.2*  GLUCOSE 163* 132*    Protein Profile: No results for input(s): ALBUMIN in the last 168 hours.  Nutritional Anemia Profile:  CBC Latest Ref Rng 03/24/2015 03/23/2015 02/18/2015  WBC 3.8 - 10.6 K/uL 10.1 11.4(H) 7.2  Hemoglobin 13.0 - 18.0 g/dL 12.0(L) 13.6 11.8(L)  Hematocrit 40.0 - 52.0 % 35.9(L) 42.2 36.2(L)  Platelets 150 - 440 K/uL 263 348 245   Gastrointestinal Profile: Last BM: 6/26   Nutrition-Focused Physical Exam Findings: Nutrition-Focused physical exam completed. Findings are WDL fat depletion, mild/moderate muscle depletion of calf and thigh regions however WDL for all other muscular regions such as shoulder, clavicle, temple and scapular regions, and no edema.    Weight Change: Current weight prior to thoracentesis of 181lbs. Pt reports weight loss of 10lbs in the past 3 weeks to a month. Per [facility] pt weight of 193lbs one month ago (7% weight loss in one month).   RN SOk Edwardsagreeable to reweigh pt s/p thoracentesis this afternoon, new weight 175lbs (9% weight loss in one month including fluid removed via thoracentesis).  Anthropometrics: Height:  Ht Readings from Last 1 Encounters:  03/23/15 '5\' 11"'$  (1.803 m)    Weight:  Wt Readings from Last 1 Encounters:  03/24/15 175 lb 8 oz (79.606 kg)    Wt Readings from Last 10 Encounters:  03/24/15 175 lb 8 oz (79.606 kg)  03/14/15 185 lb (83.915 kg)  03/04/15 189 lb 6 oz (85.9 kg)  02/18/15 193 lb 12.6 oz (87.9 kg)  02/11/15 192 lb (87.091 kg)    BMI:  Body mass index is 24.49 kg/(m^2).  Estimated  Nutritional Needs:  Kcal:  BEE:  1481kcals, TEE: (IF 1.1-1.3)(AF 1.2) 1955-2310kcals  Protein:  80-96g protein (1.0-1.2g/kg)   Fluid:  1998-2336m of fluid (25-38mkg)  Skin:  Reviewed, no issues  EDUCATION NEEDS:  No education needs identified at this time   Intake/Output Summary (Last 24 hours) at 03/24/15 1419 Last data filed at 03/24/15 1300  Gross per 24 hour  Intake     50 ml  Output   1100 ml  Net  -1050 ml    HIGH Care  Level  AlDwyane LuoRD, LDN Pager [phone]

## 2015-03-24 NOTE — Plan of Care (Signed)
Problem: Discharge Progression Outcomes Goal: Other Discharge Outcomes/Goals Plan of care progress to goal:  US Thoracentesis today - removed 1.8L, Chest xray - RUL mass, no pneumothorax. Oncology consulted - Plan is to begin inpatient Chemotherapy tomorrow 6/28. Patient remains afebrile. Appetite poor - ensure provided. NS at 50 ml/hr infusing. Palliative consulted - PRN moderate pain medication ordered. Patient now on room air, sat 98%. Family at bedside.

## 2015-03-24 NOTE — Consult Note (Addendum)
Palliative Medicine Inpatient Consult Note   Name: William Mullen Date: 03/24/2015 MRN: [id number]  DOB: 04-18-1923  Referring Physician: Adin Hector, MD  Palliative Care consult requested for this 81 y.o. male for goals of medical therapy in patient with newly dx lung ca admitted with shortness of breath  William Mullen is an 81 yo man with PMH of stage III B small cell lung cancer just dx'd with plan to start chemo this week, sarcoma of prostate/bladder s/p cystoproctatectomy (2005), DM, HTN, OA, GI bleed, PVD. He was admitted 03/23/15 with shortness of breath. CXR shows increase on the tumor in the mediastinum, right hilum, and right upper lobe and new R pleural effusion. Pt is pending thoracentesis. At present, pt is lying in bed. Complains of dyspnea. Family not present.    REVIEW OF SYSTEMS:  Pain: Moderate Dyspnea:  Yes Nausea/Vomiting:  No Diarrhea:  No Constipation:   No Depression:   No Anxiety:   No Fatigue:   No   SOCIAL HISTORY: Pt just moved to [facility] 4 days ago where he lives with his 3rd wife. Wife has lung cancer and is followed by hospice. Pt has a daughter in [place], a son in [place] and a son in [place]. His daughter is most involved in his care. Pt worked in [employer] fund[employer] and for [employer].  reports that he quit smoking about 34 years ago. He does not have any smokeless tobacco history on file. He reports that he does not drink alcohol or use illicit drugs.   CODE STATUS: DNR  PAST MEDICAL HISTORY: Past Medical History  Diagnosis Date  . Cancer of right lung   . Carcinoma of right lung 02/19/2015  . Prostate ca   . Hypertension   . Diabetes mellitus without complication     Metformin  . Renal insufficiency   . Pneumonia   . Arthritis   . Lung cancer   . Gastrointestinal bleeding ulcer     PAST SURGICAL HISTORY:  Past Surgical History  Procedure Laterality Date  . Nose surgery      broken nose  . Urostomy for bladder cancer     . Toe surgery      broken toe  . Pancreatic cyst removal    . Gastrointestinal clips for bleeding ulcer    . Peripheral vascular catheterization N/A 03/14/2015    Procedure: Glori Luis Cath Insertion;  Surgeon: Katha Cabal, MD;  Location: [facility];  Service: Cardiovascular;  Laterality: N/A;    ALLERGIES:  is allergic to amoxicillin-pot clavulanate and ciprofloxacin.  MEDICATIONS:  Current Facility-Administered Medications  Medication Dose Route Frequency Provider Last Rate Last Dose  . 0.9 %  sodium chloride infusion   Intravenous Continuous Loletha Grayer, MD 50 mL/hr at 03/24/15 0701    . acetaminophen (TYLENOL) tablet 650 mg  650 mg Oral Q6H PRN Loletha Grayer, MD   650 mg at 03/23/15 1552   Or  . acetaminophen (TYLENOL) suppository 650 mg  650 mg Rectal Q6H PRN Loletha Grayer, MD      . antiseptic oral rinse (CPC / CETYLPYRIDINIUM CHLORIDE 0.05%) solution 7 mL  7 mL Mouth Rinse q12n4p Tama High III, MD   7 mL at 03/23/15 1700  . cefTRIAXone (ROCEPHIN) 1 g in dextrose 5 % 50 mL IVPB - Premix  1 g Intravenous Q24H Loletha Grayer, MD      . chlorhexidine (PERIDEX) 0.12 % solution 15 mL  15 mL Mouth Rinse BID  Tama High III, MD   15 mL at 03/23/15 2001  . dorzolamide-timolol (COSOPT) 22.3-6.8 MG/ML ophthalmic solution 1 drop  1 drop Left Eye BID Loletha Grayer, MD   1 drop at 03/24/15 0831  . feeding supplement (ENSURE ENLIVE) (ENSURE ENLIVE) liquid 237 mL  237 mL Oral BID BM Tama High III, MD   237 mL at 03/24/15 1000  . insulin aspart (novoLOG) injection 0-5 Units  0-5 Units Subcutaneous QHS Loletha Grayer, MD   0 Units at 03/23/15 2148  . insulin aspart (novoLOG) injection 0-9 Units  0-9 Units Subcutaneous TID WC Loletha Grayer, MD   0 Units at 03/23/15 1716  . ipratropium-albuterol (DUONEB) 0.5-2.5 (3) MG/3ML nebulizer solution 3 mL  3 mL Nebulization Q6H Loletha Grayer, MD   3 mL at 03/24/15 0806  . latanoprost (XALATAN) 0.005 % ophthalmic solution 1 drop   1 drop Left Eye QHS Loletha Grayer, MD   1 drop at 03/23/15 1958  . oxyCODONE (Oxy IR/ROXICODONE) immediate release tablet 5 mg  5 mg Oral Q4H PRN Loletha Grayer, MD   5 mg at 03/24/15 0647  . pantoprazole (PROTONIX) EC tablet 40 mg  40 mg Oral BID AC Loletha Grayer, MD   40 mg at 03/24/15 0830  . polyethylene glycol (MIRALAX / GLYCOLAX) packet 17 g  17 g Oral Daily PRN Loletha Grayer, MD        Vital Signs: BP 114/56 mmHg  Pulse 87  Temp(Src) 97.9 F (36.6 C) (Oral)  Resp 20  Ht '5\' 11"'$  (1.803 m)  Wt 82.101 kg (181 lb)  BMI 25.26 kg/m2  SpO2 95% Filed Weights   03/23/15 1046  Weight: 82.101 kg (181 lb)    Estimated body mass index is 25.26 kg/(m^2) as calculated from the following:   Height as of this encounter: '5\' 11"'$  (1.803 m).   Weight as of this encounter: 82.101 kg (181 lb).  PHYSICAL EXAM:  General: ill-appearing HEENT: OP clear, moist oral mucosa Neck: Trachea midline  Cardiovascular: regular rate and rhythm Pulmonary/Chest: coarse BS's ant fields Abdominal: Soft, nontender, hypoactive bowel sounds GU: no suprapubic tenderness, urostomy in place Extremities: No edema  Neurological: Grossly nonfocal Skin: no rashes Psychiatric: alert, oriented  LABS: CBC:  Recent Labs Lab 03/23/15 1103 03/24/15 0504  WBC 11.4* 10.1  HGB 13.6 12.0*  HCT 42.2 35.9*  PLT 348 263   Comprehensive Metabolic Panel:  Recent Labs Lab 03/23/15 1103 03/24/15 0504  NA 136 136  K 4.8 4.5  CL 102 107  CO2 23 24  GLUCOSE 163* 132*  BUN 56* 50*  CREATININE 1.52* 1.27*  CALCIUM 9.1 8.2*    IMPRESSION: William Mullen is an 81 yo man with PMH of stage III B small cell lung cancer just dx'd with plan to start chemo this week, sarcoma of prostate/bladder s/p cystoproctatectomy (2005), DM, HTN, OA, GI bleed, PVD. He was admitted 03/23/15 with shortness of breath. CXR shows increase on the tumor in the mediastinum, R hilum, and R upper lobe and new R pleural effusion. Pt is pending  thoracentesis.  I spoke with pt. He says that since his dx 2 months ago, he has become increasingly debillitated. He cannot ambulate on his own and uses [employer]. He is very distressed about his condition and remains hopeful that he will have a good response to chemotherapy. Note that his wife also has lung cancer and is followed by hospice.   I spoke with pt about morphine for dyspnea and  he would like to try this. Will order.   With pt's permission, I spoke with wife's hospice RN. Informed him of pt's hospitalization. He will check on wife.   Pt confirms DNR.   PLAN: As above   More than 50% of the visit was spent in counseling/coordination of care: YES  Time spent: 70 minutes

## 2015-03-24 NOTE — Care Management (Signed)
Admitted to [facility] with the diagnosis of pleural effusion. Lives with wife, William Mullen, at [facility] x 4 days. Sister is William Mullen [phone] or [phone]) Prior to [facility] was at [facility]. Sees Dr. Ramonita Lab. Goes to Cancer  Last seen a week ago. No home oxygen. No skilled facility. Home Health in 2005. Doesn't remember name of agency. Uses a hover round chair to get around in the home. Decreased appetite for awhile.  Lung Cancer. Scheduled for Ultrasound Guided Thoracentesis today. Shelbie Ammons RN MSN Care Management [phone]

## 2015-03-25 ENCOUNTER — Inpatient Hospital Stay: Payer: Medicare Other

## 2015-03-25 LAB — COMPREHENSIVE METABOLIC PANEL
ALT: 16 U/L — ABNORMAL LOW (ref 17–63)
AST: 30 U/L (ref 15–41)
Albumin: 2.6 g/dL — ABNORMAL LOW (ref 3.5–5.0)
Alkaline Phosphatase: 83 U/L (ref 38–126)
Anion gap: 6 (ref 5–15)
BUN: 38 mg/dL — AB (ref 6–20)
CALCIUM: 8.6 mg/dL — AB (ref 8.9–10.3)
CO2: 26 mmol/L (ref 22–32)
CREATININE: 1.11 mg/dL (ref 0.61–1.24)
Chloride: 105 mmol/L (ref 101–111)
GFR calc Af Amer: >60 mL/min (ref >60)
GFR calc non Af Amer: 57 mL/min — ABNORMAL LOW (ref >60)
GLUCOSE: 129 mg/dL — AB (ref 65–99)
Potassium: 5 mmol/L (ref 3.5–5.1)
Sodium: 137 mmol/L (ref 135–145)
TOTAL PROTEIN: 5.4 g/dL — AB (ref 6.5–8.1)
Total Bilirubin: 0.6 mg/dL (ref 0.3–1.2)

## 2015-03-25 LAB — CBC
HEMATOCRIT: 38.8 % — AB (ref 40.0–52.0)
Hemoglobin: 12.7 g/dL — ABNORMAL LOW (ref 13.0–18.0)
MCH: 27.7 pg (ref 26.0–34.0)
MCHC: 32.7 g/dL (ref 32.0–36.0)
MCV: 84.7 fL (ref 80.0–100.0)
PLATELETS: 274 10*3/uL (ref 150–440)
RBC: 4.58 MIL/uL (ref 4.40–5.90)
RDW: 15 % — AB (ref 11.5–14.5)
WBC: 10.4 10*3/uL (ref 3.8–10.6)

## 2015-03-25 LAB — GLUCOSE, CAPILLARY
GLUCOSE-CAPILLARY: 121 mg/dL — AB (ref 65–99)
GLUCOSE-CAPILLARY: 148 mg/dL — AB (ref 65–99)
Glucose-Capillary: 177 mg/dL — ABNORMAL HIGH (ref 65–99)

## 2015-03-25 LAB — PATHOLOGIST SMEAR REVIEW

## 2015-03-25 MED ORDER — DORZOLAMIDE HCL-TIMOLOL MAL 2-0.5 % OP SOLN
1.0000 [drp] | Freq: Two times a day (BID) | OPHTHALMIC | Status: DC
  Administered 2015-03-26: 1 [drp] via OPHTHALMIC
  Filled 2015-03-25: qty 10

## 2015-03-25 MED ORDER — MORPHINE SULFATE 2 MG/ML IJ SOLN
2.0000 mg | INTRAMUSCULAR | Status: AC
  Administered 2015-03-25: 2 mg via INTRAVENOUS
  Filled 2015-03-25: qty 1

## 2015-03-25 MED ORDER — IPRATROPIUM-ALBUTEROL 0.5-2.5 (3) MG/3ML IN SOLN
3.0000 mL | Freq: Four times a day (QID) | RESPIRATORY_TRACT | Status: DC | PRN
  Administered 2015-03-25 – 2015-03-26 (×2): 3 mL via RESPIRATORY_TRACT
  Filled 2015-03-25 (×2): qty 3

## 2015-03-25 MED ORDER — MORPHINE SULFATE 2 MG/ML IJ SOLN
1.0000 mg | INTRAMUSCULAR | Status: AC
  Administered 2015-03-25: 12:00:00 1 mg via INTRAVENOUS
  Filled 2015-03-25: qty 1

## 2015-03-25 MED ORDER — LATANOPROST 0.005 % OP SOLN
1.0000 [drp] | Freq: Every day | OPHTHALMIC | Status: DC
  Filled 2015-03-25: qty 2.5

## 2015-03-25 MED ORDER — MORPHINE SULFATE (CONCENTRATE) 10 MG/0.5ML PO SOLN
10.0000 mg | ORAL | Status: DC | PRN
Start: 2015-03-25 — End: 2015-03-26
  Administered 2015-03-25 – 2015-03-26 (×2): 10 mg via ORAL
  Filled 2015-03-25 (×3): qty 0.5

## 2015-03-25 NOTE — Progress Notes (Signed)
Pt expressed that he did NOT want to take the chemotherapy and that he wanted to go home with Hospice.  Dr. Caryl Comes, Dr. Johnney Killian and Dr. Oliva Bustard notifed, support given. Family at bedside.

## 2015-03-25 NOTE — Progress Notes (Signed)
New referral for [facility] at [facility] after discharge. Mr Waddington is an 81 yo man with PMH of stage III B small cell lung cancer just dx'd with plan to start chemo this week, cancer the of prostate/bladder s/p cystoproctatectomy (2005), DM, HTN, OA, GI bleed, PVD. He was admitted 03/23/15 with shortness of breath. CXR showed increase in the tumor in the mediastinum, right hilum, and right upper lobe and new R pleural effusion. Thoracentisis was performed on 6/27, 1.8 liters were removed. Patient has declined chemo and after meeting with Palliative Care has chosen to return to [facility] with hospice services. Writer met with patient, his daughter Butch Penny, wife Everlene Farrier, as well as his brorther and sister in law in the room. Patient's wife is a current [facility] patient and is familiar with services. They ave just recently moved into the room at [facility]. Questions answered. No DME needs at present. Patient has an Transport planner and nebulizer machine. He  appeared short of breath during the visit, on room air. He also c/o a head ache, staff RN Tracie notified and patient was to be given tylenol, he had previously received IV morphine for knee and head pain.  Patient will need EMS transport at discharge, he not ambulating at this time. RN, CM, MD and CSW aware of plan for discharge with hospice services. Signed portable DNR and prescriptions for liquid morphine and lorazepam will need to accompany him Thank you. Flo Shanks RN, BSN, [facility] [place], [facility] [phone] c Flo Shanks

## 2015-03-25 NOTE — Progress Notes (Signed)
   03/25/15 0538  Vitals  Temp 98.1 F (36.7 C)  Temp Source Oral  BP (!) 104/55 mmHg  BP Method Automatic  Patient Position (if appropriate) Sitting  Pulse Rate 75  Oxygen Therapy  SpO2 94 %  O2 Device Room Air  Spoke with Dr. Netty Starring about pt's BP. MD ok with current BP stated pt's rounding MD would be by soon. Gave telephone order to change pt's parameters. Clarise Cruz, RN

## 2015-03-25 NOTE — Progress Notes (Signed)
Pt reports eye drops should go in "right eye" that he no longer needs them in his left eye since surgery. Reports it needs to be corrected; pt advised Dr. Dorthula Nettles is his eye doctor in case verification needs to be done. TC to Dr. Neldon Mc with order modified per pt request.

## 2015-03-25 NOTE — Progress Notes (Signed)
I received a phone call from nurse upstairs that patient did not want any chemotherapy at present time. I went in reexamining patient and had prolonged discussion with patient and his wife.  They have decided that S treatment is not curative and he wanted to go home with hospice and consider palliative chemotherapy option. Patient was seen by palliative care physician as well as was referred to hospice. Patient understood that his shortness of breath or get worse over the period of time.  And has to be controlled with morphine. Family was present during discussion. Patient was examined and all  lab data i  Has been  reviewed

## 2015-03-25 NOTE — Progress Notes (Signed)
   03/25/15 1400  Clinical Encounter Type  Visited With Patient and family together  Visit Type Initial  Visited briefly with patient and family.  Family informed me that their own non-denominational pastor was stopping by to visit with them.  I let them know our department was there if needed.  Patient and family thanked me for visiting with them and wished me a nice day.  [place] [phone]

## 2015-03-25 NOTE — Clinical Social Work Note (Signed)
Clinical Social Work Assessment  Patient Details  Name: William Mullen MRN: [id number] Date of Birth: 1922/12/31  Date of referral:  03/25/15               Reason for consult:  Facility Placement                Permission sought to share information with:  Family Supports Permission granted to share information::  Yes, Verbal Permission Granted  Name::      (daughter and wife)   Housing/Transportation Living arrangements for the past 2 months:  [place] of Information:    Patient Interpreter Needed:  None Criminal Activity/Legal Involvement Pertinent to Current Situation/Hospitalization:  No - Comment as needed Significant Relationships:  Adult Children, Spouse Lives with:  Facility Resident Do you feel safe going back to the place where you live?  Yes Need for family participation in patient care:  No (Coment)  Care giving concerns:  No caregiver concerns identified.   Social Worker assessment / plan:  CSW met with pt and family to address consult. Pt has decided to stop chemotherapy and transition to Hospice services. Pt was seen by Palliative. Pt has chosen return to [facility] with [facility] following. CSW spoke with facility, and they will be able to return with [facility]. CSW will notify Hospital Liaison of choice. Pt will likely be transported via EMS.   FL2 is on chart for MD signature. It will be updated at discharge.   Employment status:  Retired Nurse, adult PT Recommendations:  Not assessed at this time Information / Referral to community resources:  Other (Comment Required) (Hospice)  Patient/Family's Response to care:  Pt and family were very pleasant and agreeable to discharge plan.   Patient/Family's Understanding of and Emotional Response to Diagnosis, Current Treatment, and Prognosis:  Pt is accepting of hospice services.   Emotional Assessment Appearance:  Appears stated age,  Well-Groomed Attitude/Demeanor/Rapport:  Other (appropriate) Affect (typically observed):  Accepting, Adaptable, Stoic Orientation:  Oriented to Situation, Oriented to  Time, Oriented to Place, Oriented to Self Alcohol / Substance use:  Never Used Psych involvement (Current and /or in the community):  No (Comment)  Discharge Needs  Concerns to be addressed:  No discharge needs identified Readmission within the last 30 days:  No Current discharge risk:  None Barriers to Discharge:  No Barriers Identified   Darden Dates, LCSW 03/25/2015, 10:57 AM

## 2015-03-25 NOTE — Plan of Care (Signed)
Problem: Discharge Progression Outcomes Goal: Other Discharge Outcomes/Goals Outcome: Progressing Plan of care progress to goal for: 1. Pain-no c/o pain this shift 2. Hemodynamically-             -VSS, pt remains afebrile             -IV fluids continue             -IV ABt continues 3. Complications-no evidence of this shift 4. Diet-pt ate 70% of meal 5. Activity-pt is 1 assist to the BR

## 2015-03-25 NOTE — Progress Notes (Signed)
Patient ID: William Mullen, male   DOB: 25-Feb-1923, 81 y.o.   MRN: [id number]   Visited pt and famly at lunch and again this afternoon; discussions with palliative care and oncology reviewed. Plan is to aim for pt comfort, with pt to go home tomorrow with Hospice. Discussed with pt, and he is very comfortable with this.  Will eliminate medications not more targeted toward comfort.

## 2015-03-25 NOTE — Progress Notes (Signed)
Patient ID: William Mullen, male   DOB: [birth year], 81 y.o.   MRN: [id number] SUBJECTIVE:  81 yo male with small lung cancer, with deterioration over last 1 month, including more SOB. Underwent repeat CT 1 wk ago which reveals rapid progression.  S/p thoracentesis with 1.8 L removed, with some improvement in resp symptoms.  Still feels generally poor.  No fever, chills  ______________________________________________________________________  ROS: Please see HPI; remainder of complete 10 point ROS is negative   Past Medical History  Diagnosis Date  . Cancer of right lung   . Carcinoma of right lung 02/19/2015  . Prostate ca   . Hypertension   . Diabetes mellitus without complication     Metformin  . Renal insufficiency   . Pneumonia   . Arthritis   . Lung cancer   . Gastrointestinal bleeding ulcer     Past Surgical History  Procedure Laterality Date  . Nose surgery      broken nose  . Urostomy for bladder cancer    . Toe surgery      broken toe  . Pancreatic cyst removal    . Gastrointestinal clips for bleeding ulcer    . Peripheral vascular catheterization N/A 03/14/2015    Procedure: Glori Luis Cath Insertion;  Surgeon: Katha Cabal, MD;  Location: [facility];  Service: Cardiovascular;  Laterality: N/A;     Current facility-administered medications:  .  0.9 %  sodium chloride infusion, , Intravenous, Continuous, Loletha Grayer, MD, Last Rate: 50 mL/hr at 03/24/15 1445 .  acetaminophen (TYLENOL) tablet 650 mg, 650 mg, Oral, Q6H PRN, 650 mg at 03/24/15 1246 **OR** acetaminophen (TYLENOL) suppository 650 mg, 650 mg, Rectal, Q6H PRN, Loletha Grayer, MD .  antiseptic oral rinse (CPC / CETYLPYRIDINIUM CHLORIDE 0.05%) solution 7 mL, 7 mL, Mouth Rinse, q12n4p, Tama High III, MD, 7 mL at 03/23/15 1700 .  CARBOplatin (PARAPLATIN) 310 mg in sodium chloride 0.9 % 100 mL chemo infusion, 310 mg, Intravenous, Once, Forest Gleason, MD .  cefTRIAXone (ROCEPHIN) 1 g in  dextrose 5 % 50 mL IVPB - Premix, 1 g, Intravenous, Q24H, Loletha Grayer, MD, 1 g at 03/24/15 1247 .  chlorhexidine (PERIDEX) 0.12 % solution 15 mL, 15 mL, Mouth Rinse, BID, Tama High III, MD, 15 mL at 03/23/15 2001 .  dorzolamide-timolol (COSOPT) 22.3-6.8 MG/ML ophthalmic solution 1 drop, 1 drop, Left Eye, BID, Loletha Grayer, MD, 1 drop at 03/24/15 1951 .  etoposide (VEPESID) 160 mg in sodium chloride 0.9 % 500 mL chemo infusion, 80 mg/m2, Intravenous, Once, Forest Gleason, MD .  Derrill Memo ON 03/26/2015] etoposide (VEPESID) 160 mg in sodium chloride 0.9 % 500 mL chemo infusion, 80 mg/m2, Intravenous, Once **FOLLOWED BY** [START ON 03/27/2015] etoposide (VEPESID) 160 mg in sodium chloride 0.9 % 500 mL chemo infusion, 80 mg/m2, Intravenous, Once, Forest Gleason, MD .  feeding supplement (ENSURE ENLIVE) (ENSURE ENLIVE) liquid 237 mL, 237 mL, Oral, TID WC, Tama High III, MD, 237 mL at 03/25/15 0740 .  fosaprepitant (EMEND) 150 mg, dexamethasone (DECADRON) 12 mg in sodium chloride 0.9 % 145 mL IVPB, , Intravenous, Once, Forest Gleason, MD .  heparin lock flush 100 unit/mL, 500 Units, Intracatheter, PRN, Tama High III, MD .  HYDROcodone-acetaminophen (NORCO/VICODIN) 5-325 MG per tablet 1 tablet, 1 tablet, Oral, Q6H PRN, Grayland Jack Phifer, MD, 1 tablet at 03/24/15 1757 .  insulin aspart (novoLOG) injection 0-5 Units, 0-5 Units, Subcutaneous, QHS, Loletha Grayer, MD, 0 Units at 03/23/15 2148 .  insulin aspart (novoLOG) injection 0-9 Units, 0-9 Units, Subcutaneous, TID WC, Loletha Grayer, MD, 2 Units at 03/24/15 1757 .  ipratropium-albuterol (DUONEB) 0.5-2.5 (3) MG/3ML nebulizer solution 3 mL, 3 mL, Nebulization, Q6H, Loletha Grayer, MD, 3 mL at 03/24/15 2001 .  latanoprost (XALATAN) 0.005 % ophthalmic solution 1 drop, 1 drop, Left Eye, QHS, Loletha Grayer, MD, 1 drop at 03/24/15 1951 .  [START ON 03/26/2015] ondansetron (ZOFRAN) 8 mg, dexamethasone (DECADRON) 8 mg in sodium chloride 0.9 % 50 mL IVPB, ,  Intravenous, Once **FOLLOWED BY** [START ON 03/27/2015] ondansetron (ZOFRAN) 8 mg, dexamethasone (DECADRON) 8 mg in sodium chloride 0.9 % 50 mL IVPB, , Intravenous, Once, Forest Gleason, MD .  oxyCODONE (Oxy IR/ROXICODONE) immediate release tablet 5 mg, 5 mg, Oral, Q4H PRN, Loletha Grayer, MD, 5 mg at 03/24/15 0647 .  palonosetron (ALOXI) injection 0.25 mg, 0.25 mg, Intravenous, Once, Forest Gleason, MD .  pantoprazole (PROTONIX) EC tablet 40 mg, 40 mg, Oral, BID AC, Loletha Grayer, MD, 40 mg at 03/24/15 1757 .  [START ON 03/28/2015] pegfilgrastim (NEULASTA) injection 6 mg, 6 mg, Subcutaneous, Once, Forest Gleason, MD .  polyethylene glycol (MIRALAX / GLYCOLAX) packet 17 g, 17 g, Oral, Daily PRN, Loletha Grayer, MD .  sodium chloride 0.9 % injection 10 mL, 10 mL, Intracatheter, PRN, Tama High III, MD  PHYSICAL EXAM:  BP 104/55 mmHg  Pulse 75  Temp(Src) 98.1 F (36.7 C) (Oral)  Resp 18  Ht 5' 11"  (1.803 m)  Wt 79.606 kg (175 lb 8 oz)  BMI 24.49 kg/m2  SpO2 94%  General: elderly male, appears SOB HEENT: PERRL; OP moist without lesions. Neck: supple, trachea midline, no thyromegaly Chest: normal to palpation Lungs: poor airflow throughout right lung field;  no retractions or wheeze Cardiovascular: RRR, no gallop; distal pulses 2+ Abdomen: soft, nontender, nondistended, positive bowel sounds Extremities: no clubbing, cyanosis, edema Neuro: alert and oriented, moves all extremities Derm: no significant rashes or nodules; decreased skin turgor Lymph: no cervical or supraclavicular lymphadenopathy  Labs and imaging studies were reviewed  ASSESSMENT/PLAN:   1. Acute resp failure/pleural effusion, likely malignant/small cell lung cancer, stage 4- discussed with oncology.  Pt to receive trial of chemotherapy, following closely for toleration.  Repeat CXR today 2. Protein/calorie malnutrition- appreciate dietary; following PO intake 3. DM- stable; cover with SSI 4. CKD stage 3- improved  with gentle hydration; following met b 5. HTN- holding meds and following

## 2015-03-25 NOTE — Plan of Care (Signed)
Problem: Discharge Progression Outcomes Goal: Discharge plan in place and appropriate Individualization of Care PT PREFERS TO BE CALLED [name] PT HAS HISTORY OF SCLCA, PROSTATE CANCER WITH UROSTOMY, HTN, DM, RENAL INSUFFICIENCY, PNEUMONIA, ARTHRITIS, AND ULCER ON MEDICATIONS AT HOME HIGH FALL PRECAUTIONS.  Goal: Other Discharge Outcomes/Goals Plan of Care Progress to Goal:  PT WITH C/O PAIN TODAY CONTROLLED WITH THE USE OF PRN MORPHINE, TYLENOL AND ROXICODONE, PT ON O2 AT INTERVALS FOR DYSPNEA AND MORPHINE GIVEN FOR DYSPNEA WITH RELIEF, FAMILY INTO VISIT, PT DECIDED TO WITHHOLD CHEMO AT THIS TIME AND GO HOME WITH HOSPICE.  MD'S ARE AWARE, HOSPICE INTO SEE PT, PT TO POSSIBLY BE D/C HOME TO William Mullen HOUSE TOMORROW AM.

## 2015-03-25 NOTE — Consult Note (Signed)
Palliative Medicine Inpatient Consult Follow Up Note   Name: William Mullen Date: 03/25/2015 MRN: [id number]  DOB: 1923/07/23  Referring Physician: Adin Hector, MD  Palliative Care consult requested for this 81 y.o. male for goals of medical therapy in patient with newly dx lung ca admitted with shortness of breath  William Mullen is lying in bed surrounded by family. He refused chemotherapy this AM and says that he wants to go home.   REVIEW OF SYSTEMS:  Pain: Moderate Dyspnea:  Yes Nausea/Vomiting:  No Diarrhea:  No Constipation:   No Depression:   Yes Anxiety:   No Fatigue:   Yes  CODE STATUS: DNR   PAST MEDICAL HISTORY: Past Medical History  Diagnosis Date  . Cancer of right lung   . Carcinoma of right lung 02/19/2015  . Prostate ca   . Hypertension   . Diabetes mellitus without complication     Metformin  . Renal insufficiency   . Pneumonia   . Arthritis   . Lung cancer   . Gastrointestinal bleeding ulcer     PAST SURGICAL HISTORY:  Past Surgical History  Procedure Laterality Date  . Nose surgery      broken nose  . Urostomy for bladder cancer    . Toe surgery      broken toe  . Pancreatic cyst removal    . Gastrointestinal clips for bleeding ulcer    . Peripheral vascular catheterization N/A 03/14/2015    Procedure: Glori Luis Cath Insertion;  Surgeon: Katha Cabal, MD;  Location: [facility];  Service: Cardiovascular;  Laterality: N/A;    Vital Signs: BP 104/55 mmHg  Pulse 75  Temp(Src) 98.1 F (36.7 C) (Oral)  Resp 18  Ht '5\' 11"'$  (1.803 m)  Wt 79.606 kg (175 lb 8 oz)  BMI 24.49 kg/m2  SpO2 94% Filed Weights   03/23/15 1046 03/24/15 1334  Weight: 82.101 kg (181 lb) 79.606 kg (175 lb 8 oz)    Estimated body mass index is 24.49 kg/(m^2) as calculated from the following:   Height as of this encounter: '5\' 11"'$  (1.803 m).   Weight as of this encounter: 79.606 kg (175 lb 8 oz).  PHYSICAL EXAM: General: ill-appearing HEENT: OP  clear, moist oral mucosa Neck: Trachea midline  Cardiovascular: regular rate and rhythm Pulmonary: clear ant fields Abdominal: Soft, nontender, + bowel sounds GU: no suprapubic tenderness, urostomy in place Extremities: No edema  Neurological: Grossly nonfocal Skin: no rashes Psychiatric: alert, oriented  LABS: CBC:    Component Value Date/Time   WBC 10.4 03/25/2015 0536   WBC 3.8 11/07/2013 1815   HGB 12.7* 03/25/2015 0536   HGB 12.5* 11/07/2013 1815   HCT 38.8* 03/25/2015 0536   HCT 33.9* 11/09/2013 0530   PLT 274 03/25/2015 0536   PLT 129* 11/07/2013 1815   MCV 84.7 03/25/2015 0536   MCV 89 11/07/2013 1815   NEUTROABS 4.7 02/18/2015 1614   NEUTROABS 1.9 11/07/2013 1815   LYMPHSABS 1.7 02/18/2015 1614   LYMPHSABS 1.3 11/07/2013 1815   MONOABS 0.6 02/18/2015 1614   MONOABS 0.4 11/07/2013 1815   EOSABS 0.2 02/18/2015 1614   EOSABS 0.1 11/07/2013 1815   BASOSABS 0.0 02/18/2015 1614   BASOSABS 0.0 11/07/2013 1815   Comprehensive Metabolic Panel:    Component Value Date/Time   NA 137 03/25/2015 0536   NA 138 11/09/2013 0530   K 5.0 03/25/2015 0536   K 4.2 10/14/2014 1031   CL 105 03/25/2015 0536  CL 108* 11/09/2013 0530   CO2 26 03/25/2015 0536   CO2 25 11/09/2013 0530   BUN 38* 03/25/2015 0536   BUN 18 11/09/2013 0530   CREATININE 1.11 03/25/2015 0536   CREATININE 0.92 11/09/2013 0530   GLUCOSE 129* 03/25/2015 0536   GLUCOSE 211* 11/09/2013 0530   CALCIUM 8.6* 03/25/2015 0536   CALCIUM 8.4* 11/09/2013 0530   AST 30 03/25/2015 0536   AST 37 11/06/2013 0450   ALT 16* 03/25/2015 0536   ALT 25 11/06/2013 0450   ALKPHOS 83 03/25/2015 0536   ALKPHOS 66 11/06/2013 0450   BILITOT 0.6 03/25/2015 0536   PROT 5.4* 03/25/2015 0536   PROT 5.4* 11/06/2013 0450   ALBUMIN 2.6* 03/25/2015 0536   ALBUMIN 2.7* 11/06/2013 0450    IMPRESSION: William Mullen is an 81 yo man with PMH of stage III B small cell lung cancer just dx'd with plan to start chemo this week,  sarcoma of prostate/bladder s/p cystoproctatectomy (2005), DM, HTN, OA, GI bleed, PVD. He was admitted 03/23/15 with shortness of breath. CXR shows increase on the tumor in the mediastinum, R hilum, and R upper lobe and new R pleural effusion. Pt is s/p thoracentesis 6/27 with removal of 1.8L.   Pt was to start chemotherapy this AM but refused. He tells me that he does not want to have chemo and that he wants to go home. Wife, daughter and sister all at bedside and are supportive of pt's decision. Pt requests that he have hospice in the home as his wife does. Hospice screen placed. RN informed Dr Caryl Comes who will see pt.    PLAN: 1. Home with hospice 2. Hospice screen  REFERRALS TO BE ORDERED:  Hospice   More than 50% of the visit was spent in counseling/coordination of care: YES  Time spent: 35 minutes

## 2015-03-25 NOTE — Care Management (Signed)
Important Message  Patient Details  Name: RUDRANSH BELLANCA MRN: [id number] Date of Birth: [birth year]   Medicare Important Message Given:  Yes-second notification given    Darius Bump Allmond 03/25/2015, 10:33 AM

## 2015-03-25 NOTE — Clinical Social Work Note (Signed)
Clinical Social Worker received consult for pt as he was admitted from [place]. FL2 is complete and on chart for MD's signature. CSW will hold assessment as it was reported in progression as Palliative Care has been called for goals of care. Assessment to follow. CSW will continue to follow.   Darden Dates, MSW, LCSW Clinical Social Worker [phone]

## 2015-03-26 ENCOUNTER — Inpatient Hospital Stay: Payer: Medicare Other

## 2015-03-26 DIAGNOSIS — J90 Pleural effusion, not elsewhere classified: Secondary | ICD-10-CM

## 2015-03-26 DIAGNOSIS — J189 Pneumonia, unspecified organism: Secondary | ICD-10-CM

## 2015-03-26 DIAGNOSIS — N2889 Other specified disorders of kidney and ureter: Secondary | ICD-10-CM

## 2015-03-26 LAB — PH, BODY FLUID: pH, Body Fluid: 7.7

## 2015-03-26 LAB — CYTOLOGY - NON PAP

## 2015-03-26 MED ORDER — ACETAMINOPHEN 325 MG PO TABS: 650.0000 mg | ORAL_TABLET | Freq: Four times a day (QID) | ORAL | Status: AC | PRN

## 2015-03-26 MED ORDER — IPRATROPIUM-ALBUTEROL 0.5-2.5 (3) MG/3ML IN SOLN: 3.0000 mL | Freq: Four times a day (QID) | RESPIRATORY_TRACT | Status: AC | PRN

## 2015-03-26 MED ORDER — MORPHINE SULFATE (CONCENTRATE) 10 MG/0.5ML PO SOLN
10.0000 mg | ORAL | Status: DC | PRN
  Administered 2015-03-26: 12:00:00 20 mg via ORAL
  Administered 2015-03-26: 10 mg via ORAL
  Filled 2015-03-26: qty 0.5
  Filled 2015-03-26: qty 1

## 2015-03-26 MED ORDER — MORPHINE SULFATE (CONCENTRATE) 10 MG/0.5ML PO SOLN: 10.0000 mg | ORAL | Status: AC | PRN

## 2015-03-26 MED ORDER — ENSURE ENLIVE PO LIQD: 237.0000 mL | Freq: Three times a day (TID) | ORAL | Status: AC

## 2015-03-26 NOTE — Consult Note (Signed)
Palliative Medicine Inpatient Consult Follow Up Note   Name: William Mullen Date: 03/26/2015 MRN: [id number]  DOB: [birth year]  Referring Physician: Adin Hector, Mullen  Palliative Care consult requested for this 81 y.o. male for goals of medical therapy in patient with newly dx lung ca admitted with shortness of breath.  Pt currently resting in bed and appears SOB.  Pt is also wheezing audibly.  Family at bedside.    REVIEW OF SYSTEMS:  Pain: Mild Dyspnea:  Yes Nausea/Vomiting:  No Fatigue:   Yes  CODE STATUS: DNR   PAST MEDICAL HISTORY: Past Medical History  Diagnosis Date  . Cancer of right lung   . Carcinoma of right lung 02/19/2015  . Prostate ca   . Hypertension   . Diabetes mellitus without complication     Metformin  . Renal insufficiency   . Pneumonia   . Arthritis   . Lung cancer   . Gastrointestinal bleeding ulcer     PAST SURGICAL HISTORY:  Past Surgical History  Procedure Laterality Date  . Nose surgery      broken nose  . Urostomy for bladder cancer    . Toe surgery      broken toe  . Pancreatic cyst removal    . Gastrointestinal clips for bleeding ulcer    . Peripheral vascular catheterization N/A 03/14/2015    Procedure: William Mullen Cath Insertion;  Surgeon: William Mullen;  Location: [facility];  Service: Cardiovascular;  Laterality: N/A;    Vital Signs: BP 132/64 mmHg  Pulse 85  Temp(Src) 97.8 F (36.6 C) (Oral)  Resp 22  Ht '5\' 11"'$  (1.803 m)  Wt 79.606 kg (175 lb 8 oz)  BMI 24.49 kg/m2  SpO2 93% Filed Weights   03/23/15 1046 03/24/15 1334  Weight: 82.101 kg (181 lb) 79.606 kg (175 lb 8 oz)    Estimated body mass index is 24.49 kg/(m^2) as calculated from the following:   Height as of this encounter: '5\' 11"'$  (1.803 m).   Weight as of this encounter: 79.606 kg (175 lb 8 oz).  PHYSICAL EXAM: General: critically ill-appearing HEENT: OP clear, moist oral mucosa Neck: Trachea midline  Cardiovascular: regular rate and  rhythm Pulmonary: audible wheezes, aucultation reveals inspiratory wheezes  anteriorally to all fields Abdominal: Soft, nontender, + bowel sounds GU: no suprapubic tenderness, urostomy in place Extremities: No edema  Neurological: Grossly nonfocal Skin: no rashes Psychiatric: alert, oriented   LABS: CBC:    Component Value Date/Time   WBC 10.4 03/25/2015 0536   WBC 3.8 11/07/2013 1815   HGB 12.7* 03/25/2015 0536   HGB 12.5* 11/07/2013 1815   HCT 38.8* 03/25/2015 0536   HCT 33.9* 11/09/2013 0530   PLT 274 03/25/2015 0536   PLT 129* 11/07/2013 1815   MCV 84.7 03/25/2015 0536   MCV 89 11/07/2013 1815   NEUTROABS 4.7 02/18/2015 1614   NEUTROABS 1.9 11/07/2013 1815   LYMPHSABS 1.7 02/18/2015 1614   LYMPHSABS 1.3 11/07/2013 1815   MONOABS 0.6 02/18/2015 1614   MONOABS 0.4 11/07/2013 1815   EOSABS 0.2 02/18/2015 1614   EOSABS 0.1 11/07/2013 1815   BASOSABS 0.0 02/18/2015 1614   BASOSABS 0.0 11/07/2013 1815   Comprehensive Metabolic Panel:    Component Value Date/Time   NA 137 03/25/2015 0536   NA 138 11/09/2013 0530   K 5.0 03/25/2015 0536   K 4.2 10/14/2014 1031   CL 105 03/25/2015 0536   CL 108* 11/09/2013 0530   CO2 26  03/25/2015 0536   CO2 25 11/09/2013 0530   BUN 38* 03/25/2015 0536   BUN 18 11/09/2013 0530   CREATININE 1.11 03/25/2015 0536   CREATININE 0.92 11/09/2013 0530   GLUCOSE 129* 03/25/2015 0536   GLUCOSE 211* 11/09/2013 0530   CALCIUM 8.6* 03/25/2015 0536   CALCIUM 8.4* 11/09/2013 0530   AST 30 03/25/2015 0536   AST 37 11/06/2013 0450   ALT 16* 03/25/2015 0536   ALT 25 11/06/2013 0450   ALKPHOS 83 03/25/2015 0536   ALKPHOS 66 11/06/2013 0450   BILITOT 0.6 03/25/2015 0536   PROT 5.4* 03/25/2015 0536   PROT 5.4* 11/06/2013 0450   ALBUMIN 2.6* 03/25/2015 0536   ALBUMIN 2.7* 11/06/2013 0450    IMPRESSION:  William Mullen is an 81 yo man with PMH of stage III B small cell lung cancer just dx'd with plan to start chemo this week, sarcoma of  prostate/bladder s/p cystoproctatectomy (2005), DM, HTN, OA, GI bleed, PVD. He was admitted 03/23/15 with shortness of breath. CXR shows increase on the tumor in the mediastinum, R hilum, and R upper lobe and new R pleural effusion. Pt is s/p thoracentesis 6/27 with removal of 1.8L.   Pt resting in bed and appears to be in respiratory distress.  Nurse notified and Roxanol dose to be given with duoneb.  Patient states that he spoke with his wife and he wants to go to hospice home.  Pt states I can contact [facility] now to set up.  Orders entered.  PLAN: 1. DNR. 2. D/C to hospice home 3. roxanol and duoneb STAT     More than 50% of the visit was spent in counseling/coordination of care: YES  Time Spent: 35 minutes

## 2015-03-26 NOTE — Progress Notes (Signed)
Pt to be discharged via ems to hospice home. Alert. dyspnic on increased exertion.  Sob at rest.sl iv site d/cd. 02 in use at 2l [place].  Home with this at 2l [place]. Ems called. Pt ready for discharge

## 2015-03-26 NOTE — Plan of Care (Signed)
Problem: Discharge Progression Outcomes Goal: Other Discharge Outcomes/Goals Pt alert and oriented, has not complained of any pain or discomfort this shift, calls out for any assistance needed. Resting comfortably.

## 2015-03-26 NOTE — Progress Notes (Signed)
Follow up visit made on new referral for Hospice services at [facility] after discharge. Prior to entering patient's room, writer was advised that due to increased symptom management needs plan was for patient to transfer to the Hospice Home. Writer spoke with patient and his sister William Mullen in the room and discussed services. Patient was alert, and dyspneic with audible wheezes noted. Per chart review he has required 20 mg of liquid morphine since 8:00 this morning. Patient and William Mullen voiced their understanding and agreement of services and transfer to the Hospice Home. William Mullen reports that patient's wife William Mullen is aware of transport and both she and the patient have talked with her. Consents signed. Writer notified Dr. Caryl Comes, patient's PCP via phone call, he also voiced understanding and agrrement with transport. Signed DNR form in place in patient's chart. CM,. CSW aware of plan. Information and consents faxed to referral. Report called to [place]. Staff RN Freddie Breech to notify EMS for p/u.  Thank you. Flo Shanks RN, BSN, [facility] Liaison' \\[phone]

## 2015-03-26 NOTE — Progress Notes (Signed)
Patient ID: William Mullen, male   DOB: 1922/10/11, 81 y.o.   MRN: [id number] SUBJECTIVE:  81 yo male with small lung cancer, with deterioration over last 1 month, including more SOB. Underwent repeat CT 1 wk ago which reveals rapid progression.  S/p thoracentesis with 1.8 L removed, with some improvement in resp symptoms.  Overnight has been more SOB, though showed good response yesterday to morphine. Has elected not to pursue further chemotherapy/lung cancer treatment  ______________________________________________________________________  ROS: Please see HPI; remainder of complete 10 point ROS is negative   Past Medical History  Diagnosis Date  . Cancer of right lung   . Carcinoma of right lung 02/19/2015  . Prostate ca   . Hypertension   . Diabetes mellitus without complication     Metformin  . Renal insufficiency   . Pneumonia   . Arthritis   . Lung cancer   . Gastrointestinal bleeding ulcer     Past Surgical History  Procedure Laterality Date  . Nose surgery      broken nose  . Urostomy for bladder cancer    . Toe surgery      broken toe  . Pancreatic cyst removal    . Gastrointestinal clips for bleeding ulcer    . Peripheral vascular catheterization N/A 03/14/2015    Procedure: Glori Luis Cath Insertion;  Surgeon: Katha Cabal, MD;  Location: [place];  Service: Cardiovascular;  Laterality: N/A;     Current facility-administered medications:  .  acetaminophen (TYLENOL) tablet 650 mg, 650 mg, Oral, Q6H PRN, 650 mg at 03/25/15 1201 **OR** acetaminophen (TYLENOL) suppository 650 mg, 650 mg, Rectal, Q6H PRN, Loletha Grayer, MD .  antiseptic oral rinse (CPC / CETYLPYRIDINIUM CHLORIDE 0.05%) solution 7 mL, 7 mL, Mouth Rinse, q12n4p, Tama High III, MD, 7 mL at 03/25/15 1116 .  chlorhexidine (PERIDEX) 0.12 % solution 15 mL, 15 mL, Mouth Rinse, BID, Tama High III, MD, 15 mL at 03/25/15 2014 .  dorzolamide-timolol (COSOPT) 22.3-6.8 MG/ML ophthalmic  solution 1 drop, 1 drop, Right Eye, BID, Glendon Axe, MD .  feeding supplement (ENSURE ENLIVE) (ENSURE ENLIVE) liquid 237 mL, 237 mL, Oral, TID WC, Tama High III, MD, 237 mL at 03/25/15 1629 .  heparin lock flush 100 unit/mL, 500 Units, Intracatheter, PRN, Tama High III, MD .  HYDROcodone-acetaminophen (NORCO/VICODIN) 5-325 MG per tablet 1 tablet, 1 tablet, Oral, Q6H PRN, [place] Jack Phifer, MD, 1 tablet at 03/24/15 1757 .  ipratropium-albuterol (DUONEB) 0.5-2.5 (3) MG/3ML nebulizer solution 3 mL, 3 mL, Nebulization, Q6H PRN, Loletha Grayer, MD, 3 mL at 03/25/15 1755 .  latanoprost (XALATAN) 0.005 % ophthalmic solution 1 drop, 1 drop, Right Eye, QHS, Glendon Axe, MD .  morphine CONCENTRATE 10 MG/0.5ML oral solution 10 mg, 10 mg, Oral, Q1H PRN, Gray[place] Jack Phifer, MD, 10 mg at 03/25/15 1722 .  oxyCODONE (Oxy IR/ROXICODONE) immediate release tablet 5 mg, 5 mg, Oral, Q4H PRN, Loletha Grayer, MD, 5 mg at 03/25/15 1356 .  pantoprazole (PROTONIX) EC tablet 40 mg, 40 mg, Oral, BID AC, Loletha Grayer, MD, 40 mg at 03/25/15 1717 .  polyethylene glycol (MIRALAX / GLYCOLAX) packet 17 g, 17 g, Oral, Daily PRN, Loletha Grayer, MD .  sodium chloride 0.9 % injection 10 mL, 10 mL, Intracatheter, PRN, Tama High III, MD  PHYSICAL EXAM:  BP 132/64 mmHg  Pulse 85  Temp(Src) 97.8 F (36.6 C) (Oral)  Resp 22  Ht '5\' 11"'$  (1.803 m)  Wt 79.606 kg (  175 lb 8 oz)  BMI 24.49 kg/m2  SpO2 93%  General: elderly male, appears SOB HEENT: PERRL; OP moist without lesions. Neck: supple, trachea midline, no thyromegaly Chest: normal to palpation Lungs: poor airflow throughout right lung field;  occ retractions and wheeze Cardiovascular: RRR, no gallop; distal pulses 2+ Abdomen: soft, nontender, nondistended, positive bowel sounds Extremities: no clubbing, cyanosis, edema Neuro: alert and oriented, moves all extremities Derm: no significant rashes or nodules; decreased skin turgor Lymph: no cervical or  supraclavicular lymphadenopathy  Labs and imaging studies were reviewed  ASSESSMENT/PLAN:   1. Acute resp failure/malignant pleural effusion malignant/small cell lung cancer, stage 4- comfort measures.  Plan is home with Hospice, but need to make sure he is adequately comfortable with morphine.  If not, need to consider Hospice home.  Will revisit at lunch hr to determine.  Discussed with SW, nursing 2. Protein/calorie malnutrition- encourage PO intake 3. DM- no longer treating, given above

## 2015-03-26 NOTE — Consult Note (Signed)
Hospice Home Discharge Orders    Patient:  William Mullen is an 81 y.o., male MRN:  [id number] DOB:  [birth year] Patient phone:  [phone] (home)  Patient address:   [address]. [place] [address],     Admit: Admit to Hospice Home  Code Status: DNR  Diet: as tolerated  Activity: as tolerated  Oxygen: use for comfort  Foley: Leave foley cath for urinary incontinence/previous skin breakdown   Date of Admission:  03/23/2015   Principal Problem: Small cell lung cancer Discharge Diagnoses: Patient Active Problem List   Diagnosis Date Noted  . Small cell lung cancer [C34.90] 03/24/2015  . Acute respiratory failure [J96.00] 03/24/2015  . Protein-calorie malnutrition, severe [E43] 03/24/2015  . Pleural effusion on right [J94.8]   . Pleural effusion [J90] 03/23/2015  . DU (duodenal ulcer) [K26.9] 02/19/2015  . Glaucoma [H40.9] 02/19/2015  . H/O gastrointestinal hemorrhage [Z87.19] 02/19/2015  . Hemorrhoid [K64.9] 02/19/2015  . H/O malignant neoplasm [Z85.9] 02/19/2015  . HLD (hyperlipidemia) [E78.5] 02/19/2015  . Arthritis, degenerative [M19.90] 02/19/2015  . Carcinoma of right lung [C34.91] 02/19/2015  . Diabetes mellitus, type 2 [E11.9] 07/17/2014  . Hydronephrosis [N13.30] 07/19/2013      Medications: May crush or use liquid when appropriate. May change to rectal route if unable to swallow.    Medication List    STOP taking these medications        KLOR-CON M10 10 MEQ tablet  Generic drug:  potassium chloride     lidocaine-prilocaine cream  Commonly known as:  EMLA     pantoprazole 40 MG tablet  Commonly known as:  PROTONIX      TAKE these medications        acetaminophen 325 MG tablet  Commonly known as:  TYLENOL  Take 2 tablets (650 mg total) by mouth every 6 (six) hours as needed for mild pain (or Fever >/= 101).     amLODipine 5 MG tablet  Commonly known as:  NORVASC     atorvastatin 20 MG tablet  Commonly known as:   LIPITOR  20 mg. 0.5 tablet every evening     bimatoprost 0.03 % ophthalmic solution  Commonly known as:  LUMIGAN  Frequency:QHS   Dosage:0.0     Instructions:  Note:Dose: 0.03 %     dorzolamide-timolol 22.3-6.8 MG/ML ophthalmic solution  Commonly known as:  COSOPT     feeding supplement (ENSURE ENLIVE) Liqd  Take 237 mLs by mouth 3 (three) times daily with meals.     glyBURIDE-metformin 5-500 MG per tablet  Commonly known as:  GLUCOVANCE     ipratropium-albuterol 0.5-2.5 (3) MG/3ML Soln  Commonly known as:  DUONEB  Take 3 mLs by nebulization every 6 (six) hours as needed.     levofloxacin 500 MG tablet  Commonly known as:  LEVAQUIN  Take 500 mg by mouth daily.     lisinopril-hydrochlorothiazide 20-12.5 MG per tablet  Commonly known as:  PRINZIDE,ZESTORETIC  1 tablet 2 (two) times daily.     morphine CONCENTRATE 10 MG/0.5ML Soln concentrated solution  Take 0.5-1 mLs (10-20 mg total) by mouth every hour as needed for moderate pain, severe pain, anxiety or shortness of breath.           Comments: Pt is coming for symptom management and not EOL, pt may turn to EOL and this is why I kept the medications I did, if pt becomes EOL may D/C non-comfort medications    Signed: Maudry Mayhew 03/26/2015, 9:40 AM

## 2015-03-26 NOTE — Discharge Summary (Signed)
Physician Discharge Summary  Patient ID: William Mullen MRN: [id number] DOB/AGE: [birth year] 81 y.o.  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  Underwent thoracentesis with 1.8L drawn off; cytology positive, with markedly elevated LDH, consistent with malignant pleural effusion.  Had been placed on antibiotics, but no clear evidence of pneumonia during this visit, and symptoms were thought to be due to effusion.  Evaluated by Oncology and Palliatiive care; original plan was to consider chemotherapy to try to provide some relief, however after further thoughtful review by patient, he elected not to pursue chemotherapy.  Had planned to have him go home with Hospice, however his symptoms were rapidly progressing as was his debility, and so plan was changed to go to [facility] with comfort measures in place.  Diet and activity were as tolerated.  Pt is DNR.   Consults: oncology, palliative care   Discharge Exam: Blood pressure 132/64, pulse 85, temperature 97.8 F (36.6 C), temperature source Oral, resp. rate 22, height '5\' 11"'$  (1.803 m), weight 79.606 kg (175 lb 8 oz), SpO2 93 %.   Disposition: 50-Hospice/Home     Medication List    STOP taking these medications        KLOR-CON M10 10 MEQ tablet  Generic drug:  potassium chloride     levofloxacin 500 MG tablet  Commonly known as:  LEVAQUIN     lidocaine-prilocaine cream  Commonly known as:  EMLA      pantoprazole 40 MG tablet  Commonly known as:  PROTONIX      TAKE these medications        acetaminophen 325 MG tablet  Commonly known as:  TYLENOL  Take 2 tablets (650 mg total) by mouth every 6 (six) hours as needed for mild pain (or Fever >/= 101).     amLODipine 5 MG tablet  Commonly known as:  NORVASC     atorvastatin 20 MG tablet  Commonly known as:  LIPITOR  20 mg. 0.5 tablet every evening     bimatoprost 0.03 % ophthalmic solution  Commonly known as:  LUMIGAN  Frequency:QHS   Dosage:0.0     Instructions:  Note:Dose: 0.03 %     dorzolamide-timolol 22.3-6.8 MG/ML ophthalmic solution  Commonly known as:  COSOPT     feeding supplement (ENSURE ENLIVE) Liqd  Take 237 mLs by mouth 3 (three) times daily with meals.     glyBURIDE-metformin 5-500 MG per tablet  Commonly known as:  GLUCOVANCE     ipratropium-albuterol 0.5-2.5 (3) MG/3ML Soln  Commonly known as:  DUONEB  Take 3 mLs by nebulization every 6 (six) hours as needed.     lisinopril-hydrochlorothiazide 20-12.5 MG per tablet  Commonly known as:  PRINZIDE,ZESTORETIC  1 tablet 2 (two) times daily.     morphine CONCENTRATE 10 MG/0.5ML Soln concentrated solution  Take 0.5-1 mLs (10-20 mg total) by mouth every hour as needed for moderate pain, severe pain, anxiety or shortness of breath.       Hospice Home may elect to  eliminate any medications not targeted toward pt comfort  Signed: Tama High III 03/26/2015, 5:38 PM

## 2015-03-27 LAB — BODY FLUID CULTURE: Culture: NO GROWTH

## 2015-03-29 LAB — CULTURE, BLOOD (ROUTINE X 2)
Culture: NO GROWTH
Culture: NO GROWTH

## 2015-04-28 DEATH — deceased

## 2015-06-06 ENCOUNTER — Encounter: Payer: Self-pay | Admitting: Internal Medicine

## 2015-09-25 ENCOUNTER — Other Ambulatory Visit: Payer: Self-pay | Admitting: Nurse Practitioner

## 2016-05-19 IMAGING — CT CT HEAD WO/W CM
4 of 8 series · 13 of 33 positions shown, 15 images · IV contrast (omnipaque)
Comparison: PET-CT 02/11/2015

CLINICAL DATA: 89-year-old male with right side headache and
ataxia. Neck pain. Small cell lung cancer. Subsequent encounter.

EXAM:
CT HEAD WITHOUT AND WITH CONTRAST
CT NECK WITH CONTRAST
TECHNIQUE: Contiguous axial images were obtained from the base of the skull
through the vertex without and with intravenous contrast
Multidetector CT imaging of the and neck was performed using the
standard protocol following the bolus administration of intravenous
contrast.
CONTRAST:  75mL OMNIPAQUE IOHEXOL 300 MG/ML  SOLN

[Series 3: axial neck · axial · 0.66mm/px · z∈[+1165,+1249]mm · 2 of 126 slices shown]
[im 42/126  bone]
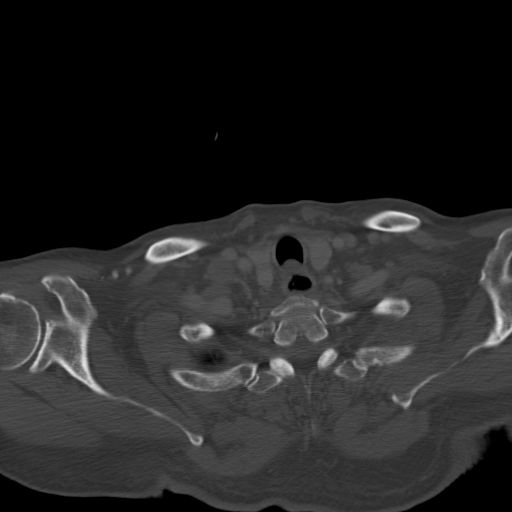
[im 84/126  bone]
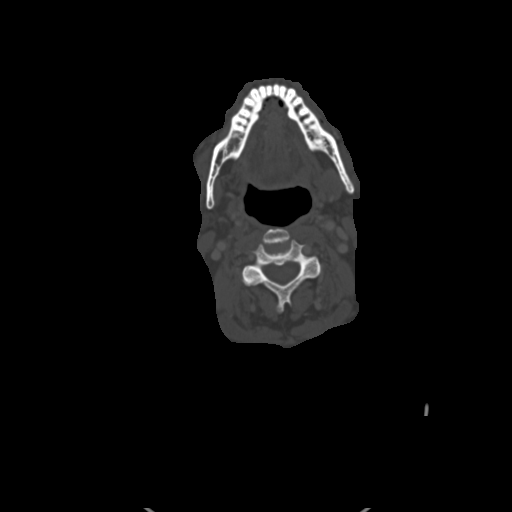

[Series 7: sag neck · sagittal · 0.56mm/px · 5 of 147 slices shown, 6 images]
[im 49/147  bone]
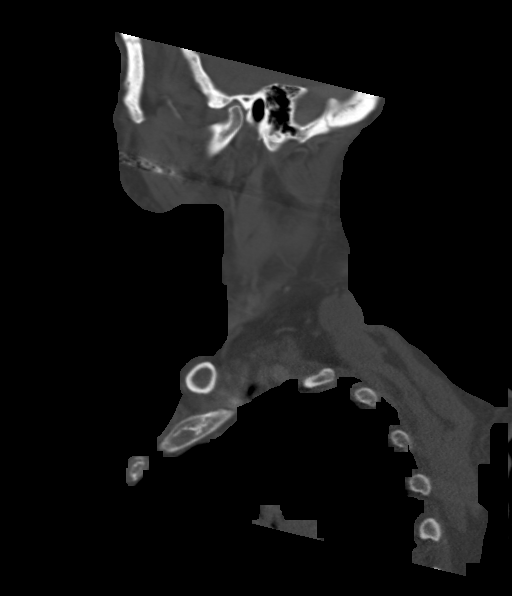
[im 61/147  bone]
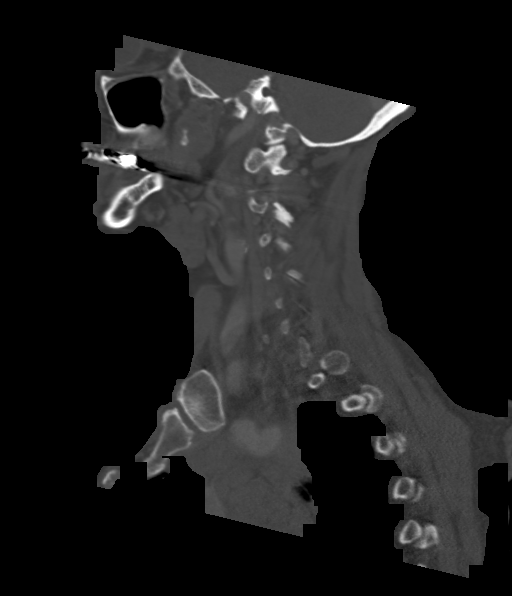
[im 74/147  soft-tissue]
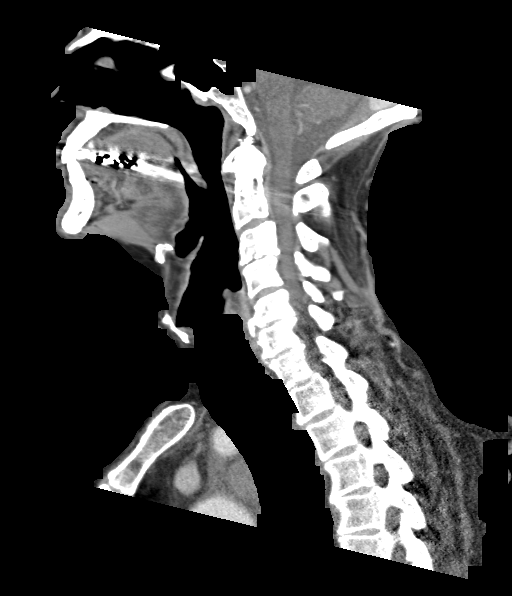
[im 74/147  bone]
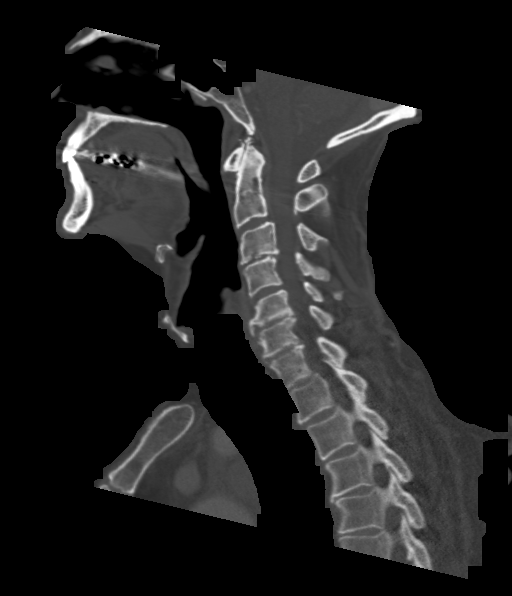
[im 86/147  bone]
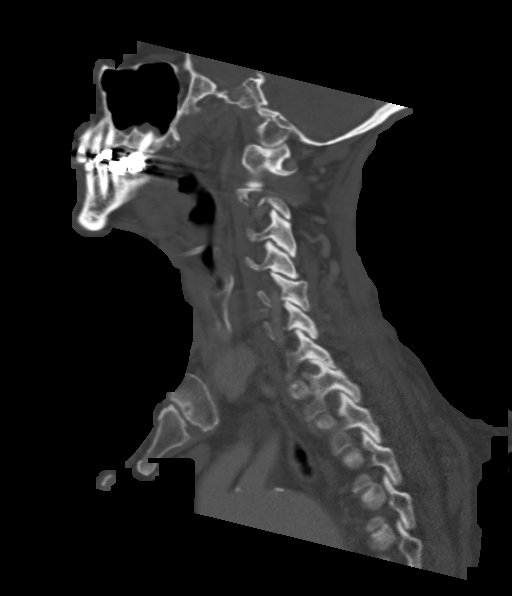
[im 98/147  bone]
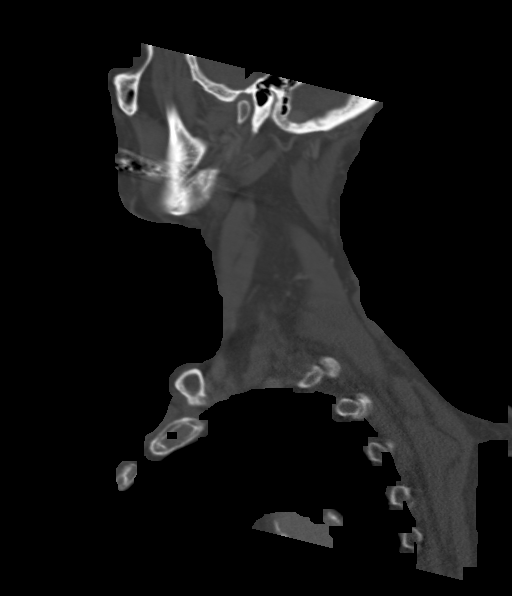

[Series 8: cor neck · coronal · 0.55mm/px · 3 of 131 slices shown]
[im 31/131  bone]
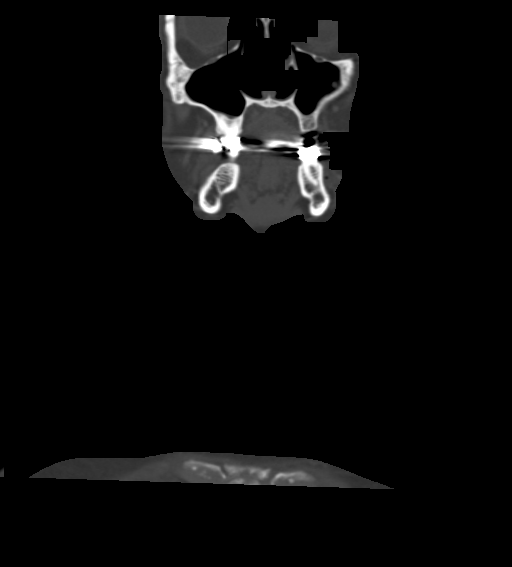
[im 54/131  bone]
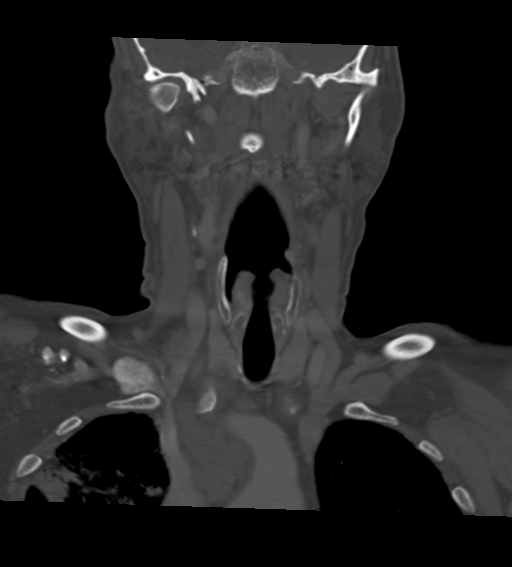
[im 77/131  bone]
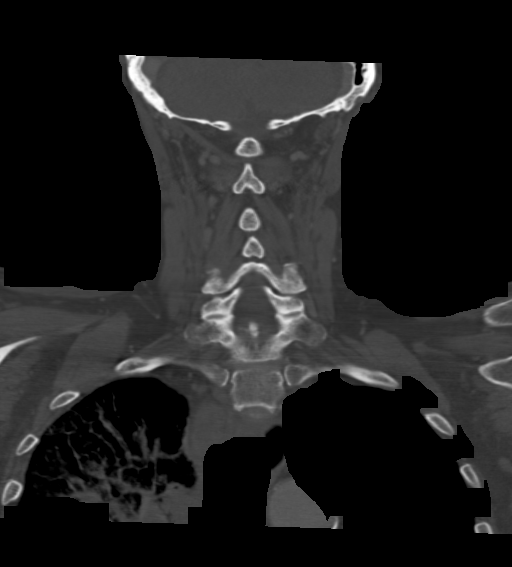

[Series 9: ax oropharynx · axial · 0.54mm/px · z∈[+1108,+1254]mm · 3 of 149 slices shown, 4 images]
[im 38/149  soft-tissue]
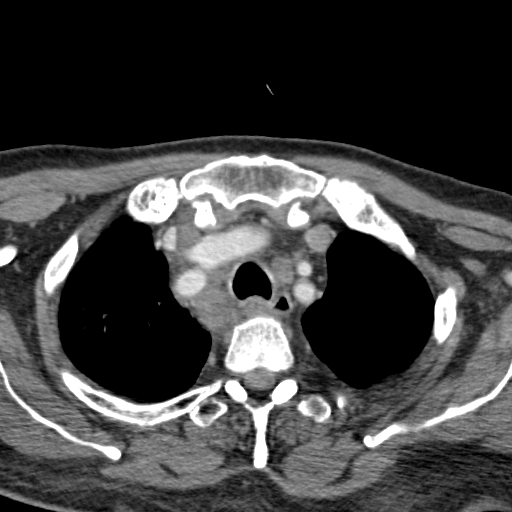
[im 38/149  bone]
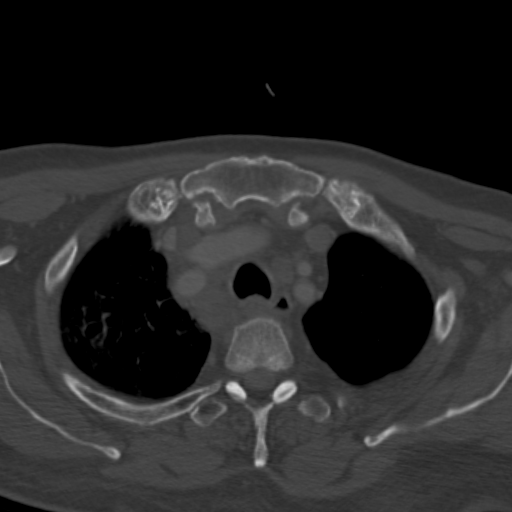
[im 75/149  bone]
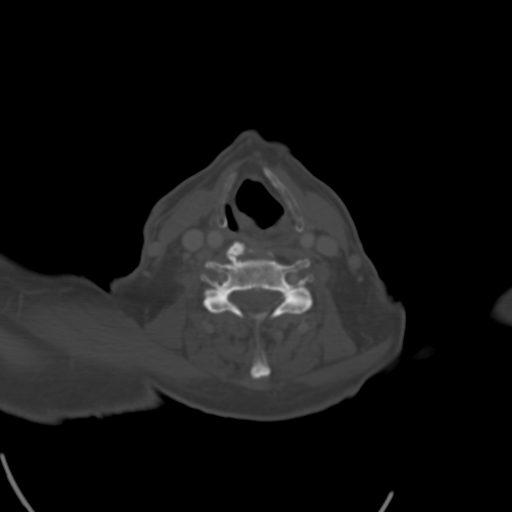
[im 112/149  bone]
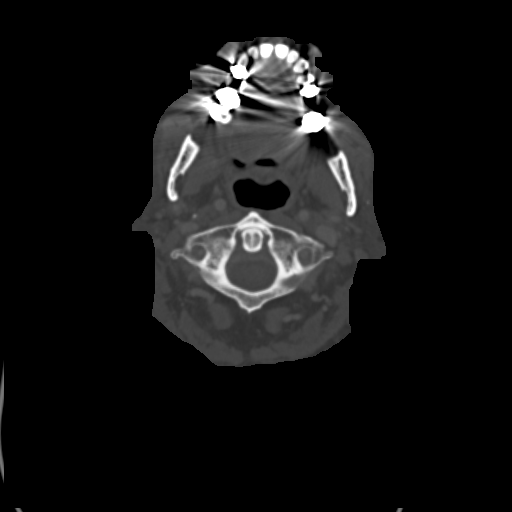

[13 of 33 positions shown; findings below may reference images not displayed]

FINDINGS: CT HEAD FINDINGS

No orbit or scalp soft tissue abnormality. Visualized paranasal
sinuses and mastoids are clear. Calvarium within normal limits.

Mild Calcified atherosclerosis at the skull base. Cerebral volume is
within normal limits for age. Mild for age nonspecific
periventricular white matter hypodensity. No midline shift,
ventriculomegaly, mass effect, evidence of mass lesion, intracranial
hemorrhage or evidence of cortically based acute infarction.
Gray-white matter differentiation is within normal limits throughout
the brain. No abnormal enhancement identified. Major intracranial
vascular structures are enhancing. Dominant left vertebral artery.

CT NECK FINDINGS

Progressed septal thickening and widespread right perihilar nodular,
irregular, and confluent pulmonary opacity since 02/11/2015 (series
5, image 50 today versus series 3, image 85 previously). Bulky
associated mediastinal and right peritracheal metastatic nodal
disease with extracapsular extension also has significantly
progressed. On series 3, image 113 today conglomerate aunt ex nodal
disease encompasses 76 x 58 mm (AP x lateral, versus individual
nodes up to 16 mm on series 3, image 73 of the comparison). New
small layering right pleural effusion is visible.

Right peritracheal nodal disease extends to the thoracic inlet (16
mm short axis node on series 3, image 91) and right level 4
lymphadenopathy with extracapsular extension has progressed (now
conglomerate disease measuring 28 x 21 mm to the right of the
thyroid lobe versus only subcentimeter right level 4 nodes
previously). The right thyroid parenchyma remain spared.

Evidence of right vocal cord paralysis. Otherwise negative larynx.
Pharynx soft tissue contours are within normal limits. Negative
parapharyngeal spaces, retropharyngeal space, sublingual space,
submandibular glands and parotid glands.

Major vascular structures in the neck and at the skullbase are
patent. In the superior mediastinum there is near complete
effacement of the superior vena cava (series 3, image 113) which
appears to remain patent currently.

No level 3 or level 2 lymphadenopathy. No level 1 or level 5
lymphadenopathy in the neck.

Degenerative changes in the visible spine. No definite acute or
metastatic osseous lesion in the neck; osteopenia of the posterior
C7 vertebral body appears stable to the comparison.
IMPRESSION: CT HEAD:

No acute or metastatic intracranial abnormality.

CT NECK:

1. Rapid progression since the recent PET-CT of right perihilar lung
malignancy, mediastinal nodal disease with extracapsular extension,
and right level 4 lower neck nodal disease with extracapsular
extension.
2. Secondary findings including right vocal cord paralysis, and
narrowing of the SVC in the superior mediastinum without thrombosis
at this time.

## 2016-05-31 IMAGING — CR DG CHEST 2V
2 series · 2 of 2 positions shown · non-contrast
Comparison: CT scan dated 01/22/2015 and PET-CT dated 02/11/2015

CLINICAL DATA: Shortness of breath.  Lung cancer.

EXAM:
CHEST  2 VIEW

[chest lat]
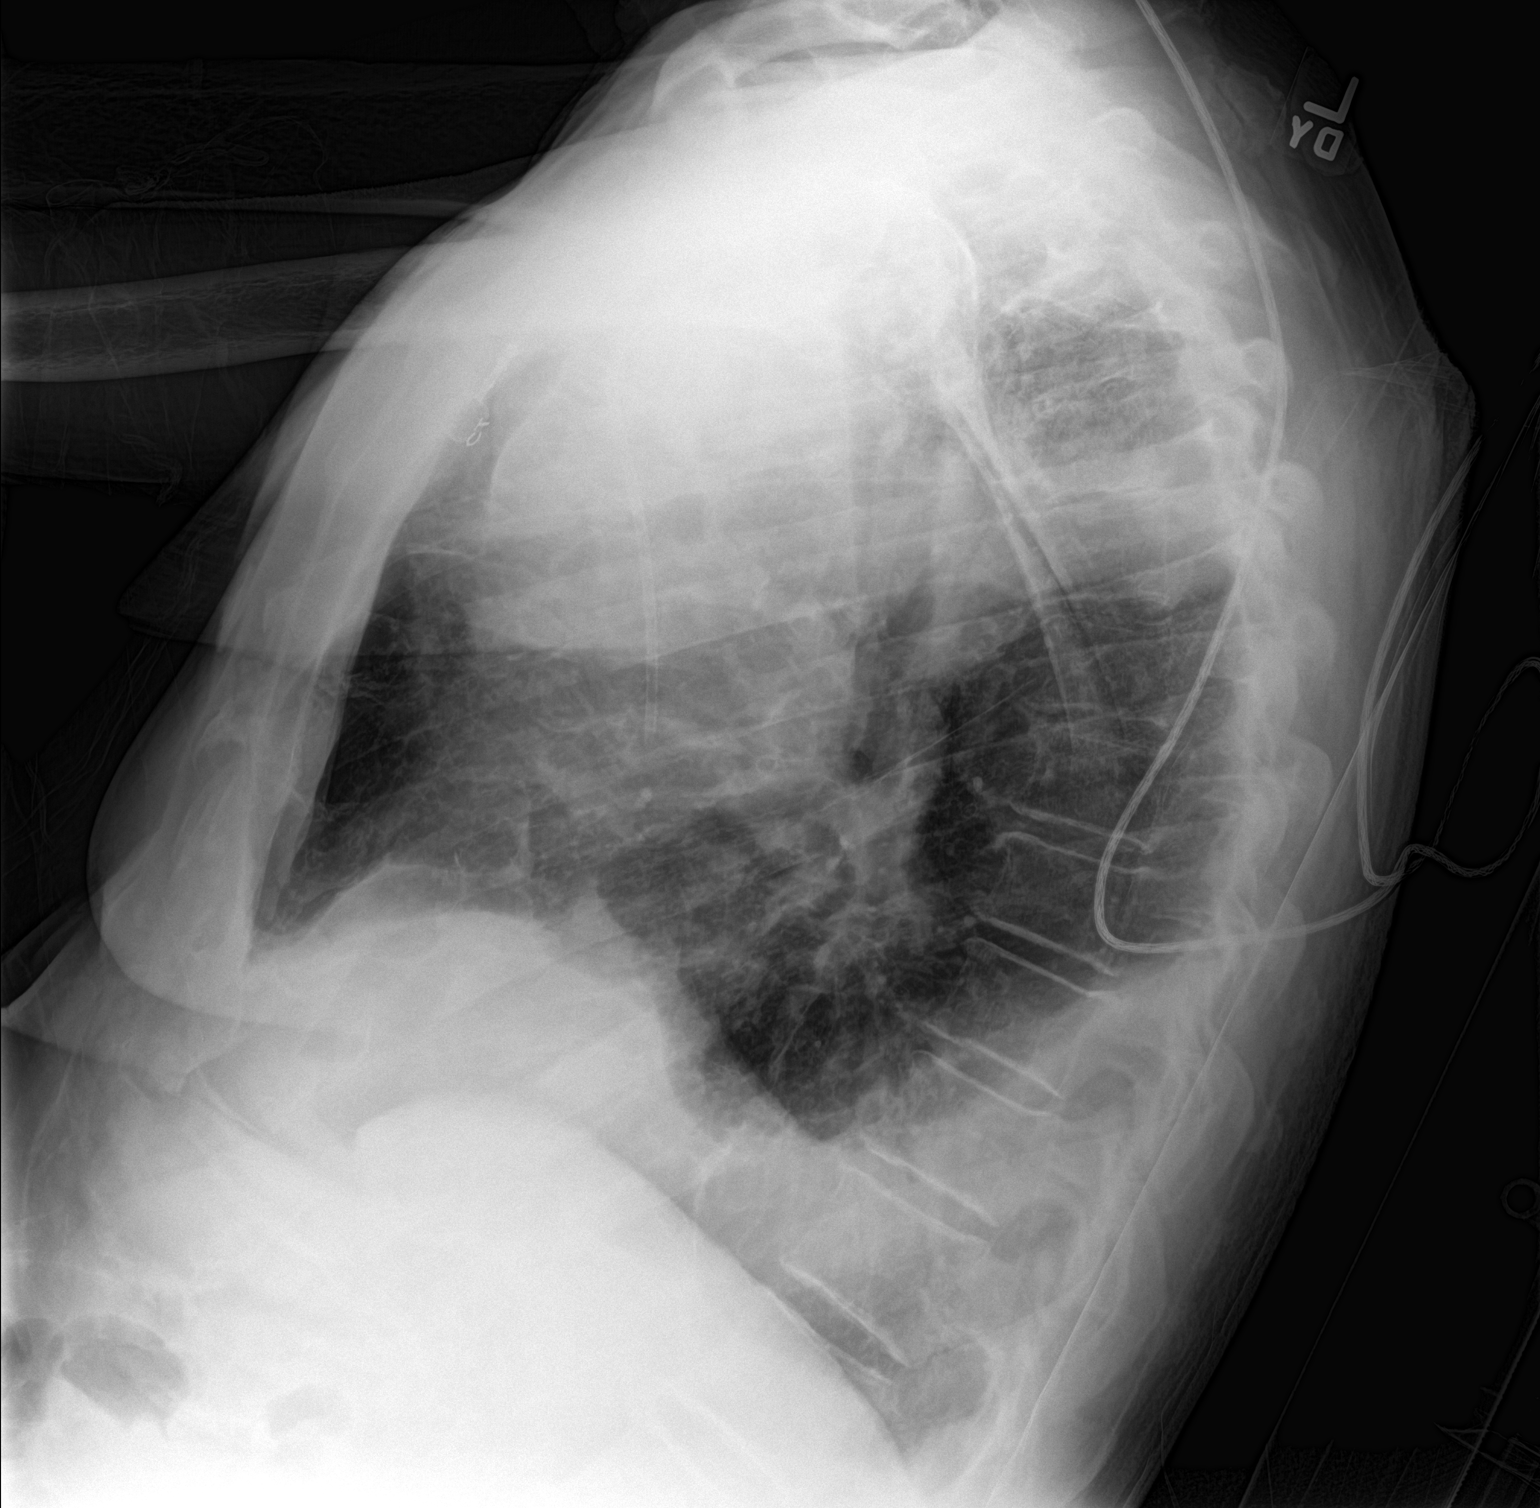

[chest ap]
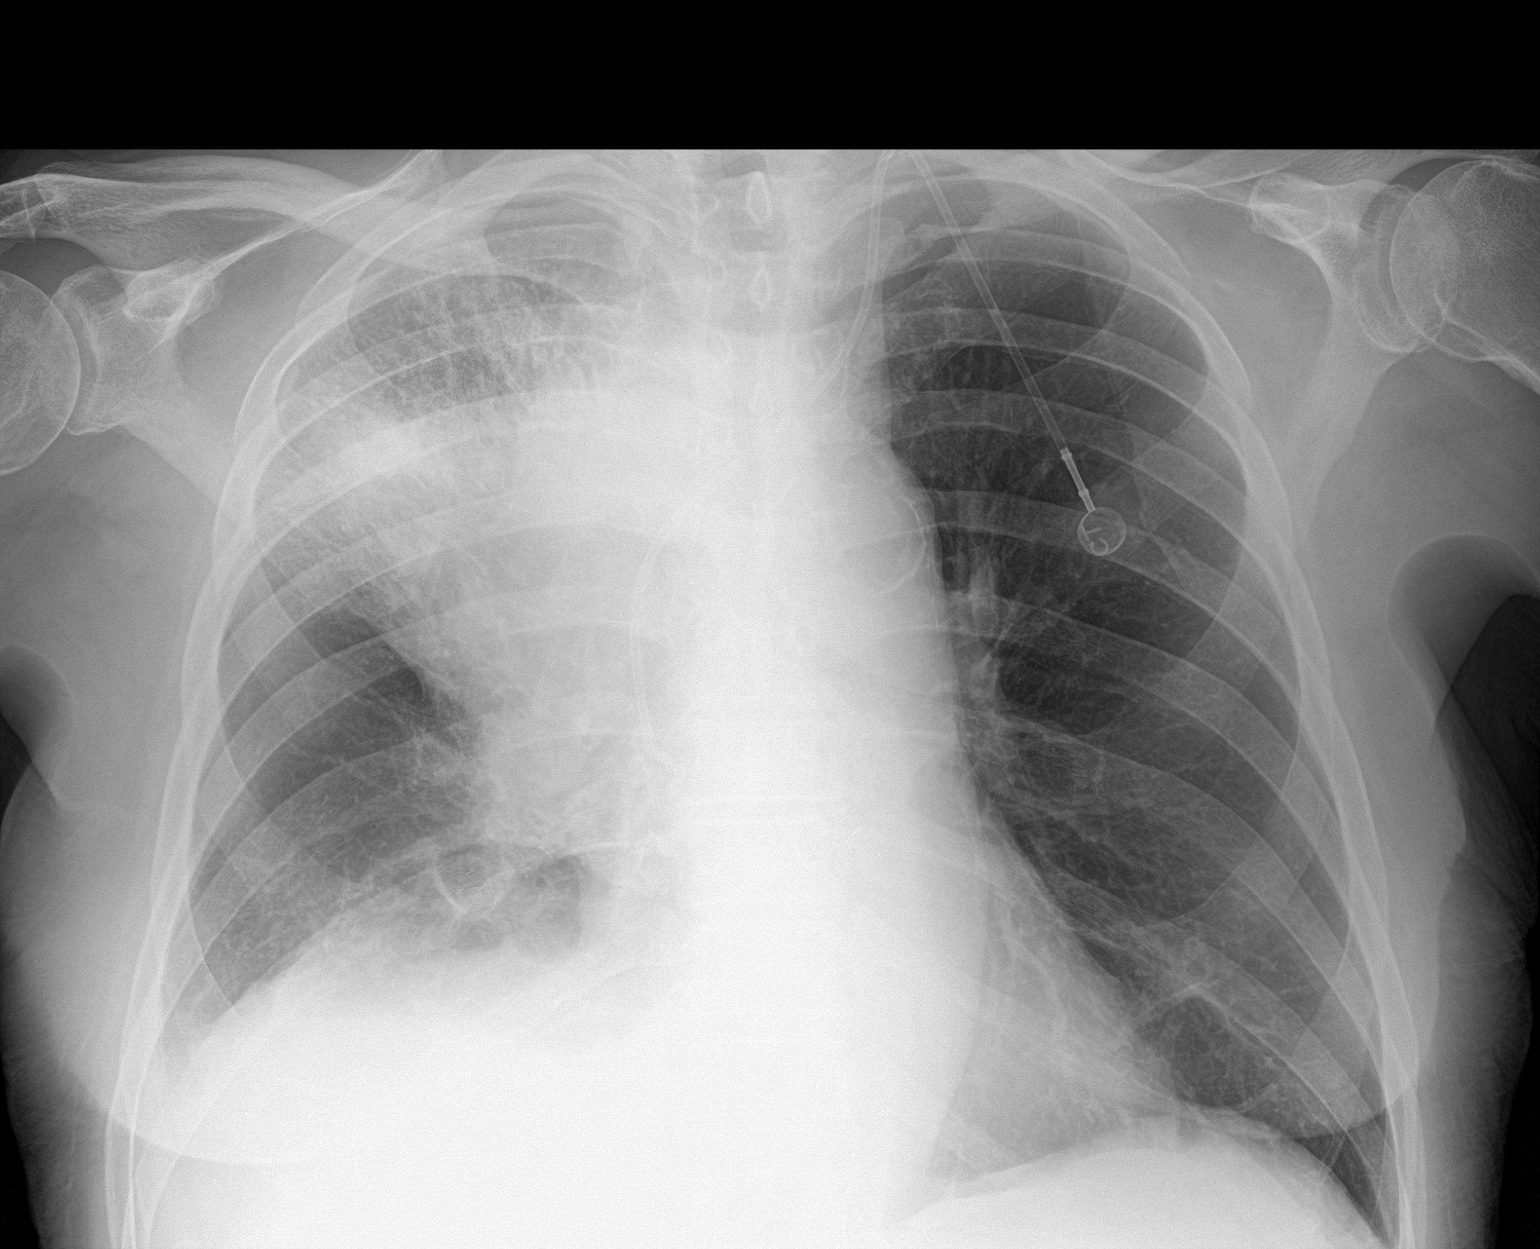

[2 of 2 positions shown; findings below may reference images not displayed]

FINDINGS: Power port in good position. The patient has developed extensive
opacification of the right upper lobe with increased mediastinal and
right hilar adenopathy. The tumor in the right upper lobe has
increased in size. New right pleural effusion.

Heart size and pulmonary vascularity are normal and the left lung is
clear. No acute osseous abnormality.
IMPRESSION: New volume loss and consolidation in the right upper lobe with
increase on the tumor in the mediastinum, right hilum, and right
upper lobe.

New moderate right pleural effusion.
